# Patient Record
Sex: Female | Born: 1975
Health system: Southern US, Community
[De-identification: ages and names within clinical notes are randomized; demographics above are authoritative.]

## PROBLEM LIST (undated history)

## (undated) DIAGNOSIS — I10 Essential (primary) hypertension: Secondary | ICD-10-CM

---

## 1999-09-06 ENCOUNTER — Inpatient Hospital Stay (HOSPITAL_COMMUNITY): Admission: AD | Admit: 1999-09-06 | Discharge: 1999-09-06 | Payer: Self-pay | Admitting: Obstetrics

## 1999-09-13 ENCOUNTER — Inpatient Hospital Stay (HOSPITAL_COMMUNITY): Admission: AD | Admit: 1999-09-13 | Discharge: 1999-09-13 | Payer: Self-pay | Admitting: Obstetrics

## 1999-12-25 ENCOUNTER — Emergency Department (HOSPITAL_COMMUNITY): Admission: EM | Admit: 1999-12-25 | Discharge: 1999-12-26 | Payer: Self-pay | Admitting: *Deleted

## 2000-01-01 ENCOUNTER — Other Ambulatory Visit: Admission: RE | Admit: 2000-01-01 | Discharge: 2000-01-01 | Payer: Self-pay | Admitting: Obstetrics & Gynecology

## 2000-02-14 ENCOUNTER — Other Ambulatory Visit: Admission: RE | Admit: 2000-02-14 | Discharge: 2000-02-14 | Payer: Self-pay | Admitting: Obstetrics & Gynecology

## 2000-03-21 ENCOUNTER — Inpatient Hospital Stay (HOSPITAL_COMMUNITY): Admission: AD | Admit: 2000-03-21 | Discharge: 2000-03-23 | Payer: Self-pay | Admitting: Obstetrics and Gynecology

## 2000-08-02 ENCOUNTER — Encounter (INDEPENDENT_AMBULATORY_CARE_PROVIDER_SITE_OTHER): Payer: Self-pay | Admitting: Specialist

## 2000-08-02 ENCOUNTER — Other Ambulatory Visit: Admission: RE | Admit: 2000-08-02 | Discharge: 2000-08-02 | Payer: Self-pay | Admitting: Obstetrics

## 2000-12-12 ENCOUNTER — Emergency Department (HOSPITAL_COMMUNITY): Admission: EM | Admit: 2000-12-12 | Discharge: 2000-12-12 | Payer: Self-pay | Admitting: Emergency Medicine

## 2000-12-31 ENCOUNTER — Inpatient Hospital Stay (HOSPITAL_COMMUNITY): Admission: AD | Admit: 2000-12-31 | Discharge: 2000-12-31 | Payer: Self-pay | Admitting: *Deleted

## 2002-01-11 ENCOUNTER — Encounter (INDEPENDENT_AMBULATORY_CARE_PROVIDER_SITE_OTHER): Payer: Self-pay | Admitting: Specialist

## 2002-01-11 ENCOUNTER — Encounter: Payer: Self-pay | Admitting: Obstetrics and Gynecology

## 2002-01-11 ENCOUNTER — Inpatient Hospital Stay (HOSPITAL_COMMUNITY): Admission: AD | Admit: 2002-01-11 | Discharge: 2002-01-13 | Payer: Self-pay | Admitting: Obstetrics and Gynecology

## 2005-01-31 ENCOUNTER — Emergency Department (HOSPITAL_COMMUNITY): Admission: EM | Admit: 2005-01-31 | Discharge: 2005-01-31 | Payer: Self-pay | Admitting: Family Medicine

## 2005-10-02 ENCOUNTER — Emergency Department (HOSPITAL_COMMUNITY): Admission: EM | Admit: 2005-10-02 | Discharge: 2005-10-02 | Payer: Self-pay | Admitting: Family Medicine

## 2006-10-25 ENCOUNTER — Emergency Department (HOSPITAL_COMMUNITY): Admission: EM | Admit: 2006-10-25 | Discharge: 2006-10-25 | Payer: Self-pay | Admitting: Emergency Medicine

## 2008-06-02 ENCOUNTER — Emergency Department (HOSPITAL_COMMUNITY): Admission: EM | Admit: 2008-06-02 | Discharge: 2008-06-02 | Payer: Self-pay | Admitting: Emergency Medicine

## 2010-03-31 ENCOUNTER — Emergency Department (HOSPITAL_COMMUNITY): Admission: EM | Admit: 2010-03-31 | Discharge: 2010-03-31 | Payer: Self-pay | Admitting: Emergency Medicine

## 2011-01-29 LAB — URINALYSIS, ROUTINE W REFLEX MICROSCOPIC
Glucose, UA: NEGATIVE mg/dL
Nitrite: NEGATIVE
Protein, ur: 30 mg/dL — AB

## 2011-01-29 LAB — URINE MICROSCOPIC-ADD ON

## 2012-11-20 ENCOUNTER — Encounter (HOSPITAL_COMMUNITY): Payer: Self-pay | Admitting: Emergency Medicine

## 2012-11-20 ENCOUNTER — Emergency Department (HOSPITAL_COMMUNITY)
Admission: EM | Admit: 2012-11-20 | Discharge: 2012-11-21 | Disposition: A | Discharge status: Home / Self Care | Payer: No Typology Code available for payment source | Attending: Emergency Medicine | Admitting: Emergency Medicine

## 2012-11-20 DIAGNOSIS — Y9389 Activity, other specified: Secondary | ICD-10-CM | POA: Insufficient documentation

## 2012-11-20 DIAGNOSIS — S8990XA Unspecified injury of unspecified lower leg, initial encounter: Secondary | ICD-10-CM | POA: Insufficient documentation

## 2012-11-20 DIAGNOSIS — Y92410 Unspecified street and highway as the place of occurrence of the external cause: Secondary | ICD-10-CM | POA: Insufficient documentation

## 2012-11-20 DIAGNOSIS — M25569 Pain in unspecified knee: Secondary | ICD-10-CM
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
## 2012-11-21 ENCOUNTER — Emergency Department (HOSPITAL_COMMUNITY): Payer: No Typology Code available for payment source

## 2012-11-21 ENCOUNTER — Emergency Department (HOSPITAL_COMMUNITY)
Admission: EM | Admit: 2012-11-21 | Discharge: 2012-11-21 | Disposition: A | Discharge status: Home / Self Care | Payer: No Typology Code available for payment source | Attending: Emergency Medicine | Admitting: Emergency Medicine

## 2012-11-21 ENCOUNTER — Encounter (HOSPITAL_COMMUNITY): Payer: Self-pay | Admitting: *Deleted

## 2012-11-21 DIAGNOSIS — F17200 Nicotine dependence, unspecified, uncomplicated: Secondary | ICD-10-CM | POA: Insufficient documentation

## 2012-11-21 DIAGNOSIS — S161XXA Strain of muscle, fascia and tendon at neck level, initial encounter: Secondary | ICD-10-CM

## 2012-11-21 DIAGNOSIS — S39012A Strain of muscle, fascia and tendon of lower back, initial encounter: Secondary | ICD-10-CM

## 2012-11-21 DIAGNOSIS — S335XXA Sprain of ligaments of lumbar spine, initial encounter: Secondary | ICD-10-CM | POA: Insufficient documentation

## 2012-11-21 DIAGNOSIS — S139XXA Sprain of joints and ligaments of unspecified parts of neck, initial encounter: Secondary | ICD-10-CM | POA: Insufficient documentation

## 2012-11-21 DIAGNOSIS — Y9389 Activity, other specified: Secondary | ICD-10-CM | POA: Insufficient documentation

## 2012-11-21 MED ORDER — IBUPROFEN 800 MG PO TABS: 800.0000 mg | ORAL_TABLET | Freq: Three times a day (TID) | ORAL | Status: DC | PRN

## 2012-11-21 MED ORDER — HYDROCODONE-ACETAMINOPHEN 5-325 MG PO TABS: 1.0000 | ORAL_TABLET | Freq: Four times a day (QID) | ORAL | Status: DC | PRN

## 2012-11-21 MED ORDER — NAPROXEN 375 MG PO TABS: 375.0000 mg | ORAL_TABLET | Freq: Two times a day (BID) | ORAL | Status: DC

## 2012-11-21 MED ORDER — NAPROXEN 250 MG PO TABS
375.0000 mg | ORAL_TABLET | Freq: Once | ORAL | Status: AC
  Administered 2012-11-21: 375 mg via ORAL
  Filled 2012-11-21: qty 2
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
## 2012-11-21 NOTE — ED Provider Notes (Signed)
  Medical screening examination/treatment/procedure(s) were performed by non-physician practitioner and as supervising physician I was immediately available for consultation/collaboration.    Vida Roller, MD 11/21/12 [phone]

## 2012-11-21 NOTE — Progress Notes (Signed)
Pt listed with no insurance coverage CM and Partnership for [facility] liaison spoke with pt. Pt offered services to assist with finding a [place] self pay provider & health reform information- Pt recently married Husband at bedside Received information from liaison  Pt states pcp is her ob gyn provider
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
## 2012-11-21 NOTE — ED Notes (Signed)
Patient transported to X-ray 
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
## 2012-11-28 NOTE — ED Provider Notes (Signed)
Medical screening examination/treatment/procedure(s) were performed by non-physician practitioner and as supervising physician I was immediately available for consultation/collaboration.   Hillman Attig M Kalyiah Saintil, MD 11/28/12 2120 

## 2013-08-18 ENCOUNTER — Emergency Department (HOSPITAL_COMMUNITY)
Admission: EM | Admit: 2013-08-18 | Discharge: 2013-08-18 | Disposition: A | Discharge status: Home / Self Care | Payer: No Typology Code available for payment source | Attending: Emergency Medicine | Admitting: Emergency Medicine

## 2013-08-18 ENCOUNTER — Encounter (HOSPITAL_COMMUNITY): Payer: Self-pay | Admitting: Emergency Medicine

## 2013-08-18 DIAGNOSIS — I10 Essential (primary) hypertension: Secondary | ICD-10-CM | POA: Diagnosis not present

## 2013-08-18 DIAGNOSIS — Y9389 Activity, other specified: Secondary | ICD-10-CM | POA: Insufficient documentation

## 2013-08-18 DIAGNOSIS — S0993XA Unspecified injury of face, initial encounter: Secondary | ICD-10-CM | POA: Diagnosis present

## 2013-08-18 DIAGNOSIS — F17200 Nicotine dependence, unspecified, uncomplicated: Secondary | ICD-10-CM | POA: Insufficient documentation

## 2013-08-18 DIAGNOSIS — Y92410 Unspecified street and highway as the place of occurrence of the external cause: Secondary | ICD-10-CM | POA: Insufficient documentation

## 2013-08-18 HISTORY — DX: Essential (primary) hypertension: I10

## 2013-08-18 MED ORDER — NAPROXEN 500 MG PO TABS: 500.0000 mg | ORAL_TABLET | Freq: Two times a day (BID) | ORAL | Status: DC

## 2013-08-18 MED ORDER — IBUPROFEN 400 MG PO TABS
800.0000 mg | ORAL_TABLET | Freq: Once | ORAL | Status: AC
  Administered 2013-08-18: 800 mg via ORAL
  Filled 2013-08-18: qty 2

## 2013-08-18 MED ORDER — DIAZEPAM 5 MG PO TABS
5.0000 mg | ORAL_TABLET | Freq: Once | ORAL | Status: AC
  Administered 2013-08-18: 5 mg via ORAL
  Filled 2013-08-18: qty 1

## 2013-08-18 MED ORDER — DIAZEPAM 5 MG PO TABS: 5.0000 mg | ORAL_TABLET | Freq: Two times a day (BID) | ORAL | Status: DC

## 2013-08-18 NOTE — ED Notes (Signed)
Pt dc to home. Pt sts understanding to dc instructions. Pt ambulatory to exit without difficulty.  Pt denies need for w/c.  
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
## 2013-08-19 ENCOUNTER — Encounter (HOSPITAL_COMMUNITY): Payer: Self-pay | Admitting: Emergency Medicine

## 2013-08-19 ENCOUNTER — Emergency Department (HOSPITAL_COMMUNITY)
Admission: EM | Admit: 2013-08-19 | Discharge: 2013-08-19 | Disposition: A | Discharge status: Home / Self Care | Payer: No Typology Code available for payment source | Attending: Emergency Medicine | Admitting: Emergency Medicine

## 2013-08-19 ENCOUNTER — Emergency Department (HOSPITAL_COMMUNITY): Payer: No Typology Code available for payment source

## 2013-08-19 DIAGNOSIS — M542 Cervicalgia: Secondary | ICD-10-CM | POA: Insufficient documentation

## 2013-08-19 DIAGNOSIS — G8911 Acute pain due to trauma: Secondary | ICD-10-CM | POA: Insufficient documentation

## 2013-08-19 DIAGNOSIS — Z79899 Other long term (current) drug therapy: Secondary | ICD-10-CM | POA: Insufficient documentation

## 2013-08-19 DIAGNOSIS — F17200 Nicotine dependence, unspecified, uncomplicated: Secondary | ICD-10-CM | POA: Insufficient documentation

## 2013-08-19 DIAGNOSIS — I10 Essential (primary) hypertension: Secondary | ICD-10-CM | POA: Insufficient documentation

## 2013-08-19 DIAGNOSIS — S139XXD Sprain of joints and ligaments of unspecified parts of neck, subsequent encounter: Secondary | ICD-10-CM

## 2013-08-19 MED ORDER — HYDROCODONE-ACETAMINOPHEN 5-325 MG PO TABS
1.0000 | ORAL_TABLET | Freq: Once | ORAL | Status: AC
  Administered 2013-08-19: 1 via ORAL
  Filled 2013-08-19: qty 1

## 2013-08-19 MED ORDER — HYDROCODONE-ACETAMINOPHEN 5-325 MG PO TABS: 1.0000 | ORAL_TABLET | ORAL | Status: DC | PRN
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
## 2013-08-19 NOTE — ED Notes (Signed)
Patient transported to X-ray 
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
## 2013-08-20 NOTE — ED Provider Notes (Signed)
Medical screening examination/treatment/procedure(s) were performed by non-physician practitioner and as supervising physician I was immediately available for consultation/collaboration.   Alvah Gilder S Jaclyn Carew, MD 08/20/13 0024 

## 2013-08-21 NOTE — ED Provider Notes (Signed)
Medical screening examination/treatment/procedure(s) were performed by non-physician practitioner and as supervising physician I was immediately available for consultation/collaboration.  Raychel Dowler R. Stepen Prins, MD 08/21/13 1016 

## 2013-09-04 ENCOUNTER — Other Ambulatory Visit: Payer: Self-pay | Admitting: *Deleted

## 2015-02-03 ENCOUNTER — Other Ambulatory Visit (HOSPITAL_COMMUNITY): Payer: Self-pay | Admitting: Obstetrics

## 2015-02-03 DIAGNOSIS — Z1231 Encounter for screening mammogram for malignant neoplasm of breast: Secondary | ICD-10-CM

## 2015-02-09 ENCOUNTER — Ambulatory Visit (HOSPITAL_COMMUNITY): Payer: No Typology Code available for payment source | Attending: Obstetrics

## 2015-02-10 ENCOUNTER — Ambulatory Visit (HOSPITAL_COMMUNITY): Payer: No Typology Code available for payment source

## 2015-02-10 ENCOUNTER — Other Ambulatory Visit (HOSPITAL_COMMUNITY): Payer: Self-pay | Admitting: Obstetrics

## 2015-02-10 DIAGNOSIS — Z1231 Encounter for screening mammogram for malignant neoplasm of breast: Secondary | ICD-10-CM

## 2015-02-17 ENCOUNTER — Ambulatory Visit (HOSPITAL_COMMUNITY): Admission: RE | Admit: 2015-02-17 | Payer: No Typology Code available for payment source | Source: Ambulatory Visit

## 2016-08-04 LAB — HM PAP SMEAR

## 2019-03-27 ENCOUNTER — Other Ambulatory Visit: Payer: Self-pay

## 2019-03-27 ENCOUNTER — Ambulatory Visit
Admission: EM | Admit: 2019-03-27 | Discharge: 2019-03-27 | Disposition: A | Discharge status: Home / Self Care | Payer: Self-pay | Attending: Physician Assistant | Admitting: Physician Assistant

## 2019-03-27 ENCOUNTER — Encounter: Payer: Self-pay | Admitting: Emergency Medicine

## 2019-03-27 DIAGNOSIS — L84 Corns and callosities: Secondary | ICD-10-CM

## 2019-03-27 DIAGNOSIS — M79674 Pain in right toe(s): Secondary | ICD-10-CM

## 2019-03-27 DIAGNOSIS — I10 Essential (primary) hypertension: Secondary | ICD-10-CM

## 2019-03-27 MED ORDER — SALICYLIC ACID 17 % EX GEL: Freq: Every day | CUTANEOUS | 0 refills | Status: DC

## 2019-03-27 NOTE — ED Provider Notes (Signed)
[facility]    CSN: [id number] Arrival date & time: 03/27/19  1221     History   Chief Complaint Chief Complaint  Patient presents with  . Toe Pain    HPI Beverly Miller is a 43 y.o. female.   43 year old female comes in for 1 week history of skin irritation/pain between her 4th and 5th toe. Her work requires long hours of standing/walking and has been working more hours recently. States pain with walking and can have swelling. She denies open wound, erythema, warmth. Denies injury/trauma. Denies ill fitted shoes. Tried using neosporin without relief.        Past Medical History:  Diagnosis Date  . Hypertension     There are no active problems to display for this patient.   History reviewed. No pertinent surgical history.  OB History   No obstetric history on file.      Home Medications    Prior to Admission medications   Medication Sig Start Date End Date Taking? Authorizing Provider  acetaminophen (TYLENOL) 325 MG tablet Take 650 mg by mouth every 6 (six) hours as needed for pain (pain).    [provider]  salicylic acid 17 % gel Apply topically daily. 03/27/19   Belinda Fisher, PA-C    Family History History reviewed. No pertinent family history.  Social History Social History   Tobacco Use  . Smoking status: Former Smoker    Last attempt to quit: 11/12/2018    Years since quitting: 0.3  . Smokeless tobacco: Never Used  Substance Use Topics  . Alcohol use: No  . Drug use: No     Allergies   Patient has no known allergies.   Review of Systems Review of Systems  Reason unable to perform ROS: See HPI as above.     Physical Exam Triage Vital Signs ED Triage Vitals  Enc Vitals Group     BP 03/27/19 1239 (!) 171/120     Pulse Rate 03/27/19 1239 77     Resp 03/27/19 1239 18     Temp 03/27/19 1239 97.9 F (36.6 C)     Temp Source 03/27/19 1239 Oral     SpO2 03/27/19 1239 98 %     Weight --      Height --      Head  Circumference --      Peak Flow --      Pain Score 03/27/19 1237 4     Pain Loc --      Pain Edu? --      Excl. in GC? --    No data found.  Updated Vital Signs BP (!) 171/120 (BP Location: Left Arm)   Pulse 77   Temp 97.9 F (36.6 C) (Oral)   Resp 18   LMP 03/13/2019   SpO2 98%   Physical Exam Constitutional:      General: She is not in acute distress.    Appearance: She is well-developed. She is not diaphoretic.  HENT:     Head: Normocephalic and atraumatic.  Eyes:     Conjunctiva/sclera: Conjunctivae normal.     Pupils: Pupils are equal, round, and reactive to light.  Musculoskeletal:     Comments: No obvious of swelling, erythema, warmth, contusion, open wounds. Callus between both 4th and 5th toe. No obvious tenderness to palpation. Full ROM of toes. Sensation intact.   Skin:    General: Skin is warm and dry.  Neurological:  Mental Status: She is alert and oriented to person, place, and time.      UC Treatments / Results  Labs (all labs ordered are listed, but only abnormal results are displayed) Labs Reviewed - No data to display  EKG None  Radiology No results found.  Procedures Procedures (including critical care time)  Medications Ordered in UC Medications - No data to display  Initial Impression / Assessment and Plan / UC Course  I have reviewed the triage vital signs and the nursing notes.  Pertinent labs & imaging results that were available during my care of the patient were reviewed by me and considered in my medical decision making (see chart for details).    No open wounds at this time. Discussed using salicylic acid to soften callus. Discussed using dressing to prevent further irritation. Return precautions given. Patient expresses understanding and agrees to plan.  Final Clinical Impressions(s) / UC Diagnoses   Final diagnoses:  Pain of toe of right foot  Callus of foot    ED Prescriptions    Medication Sig Dispense Auth.  Provider   salicylic acid 17 % gel Apply topically daily. 15 g Threasa AlphaYu, Amy V, PA-C        Yu, Amy V, [place] 03/27/19 1337

## 2019-03-27 NOTE — Discharge Instructions (Signed)
No wound at this time. No signs of infection. Use corn dressing to prevent further pressure. Warm soaks of the foot for 5 mins prior to application of salicylic acid. Monitor for open wound/ulceration, follow up for reevaluation needed.

## 2019-03-27 NOTE — ED Triage Notes (Signed)
Pt presents to [facility] for assessment of irritation to the skin of the 4th and 5th toe x 1 week, worse when she puts a shoe on and it rubs.

## 2019-03-27 NOTE — ED Notes (Signed)
Patient able to ambulate independently  

## 2019-08-27 ENCOUNTER — Ambulatory Visit
Admission: EM | Admit: 2019-08-27 | Discharge: 2019-08-27 | Disposition: A | Discharge status: Home / Self Care | Payer: HRSA Program | Attending: Physician Assistant | Admitting: Physician Assistant

## 2019-08-27 DIAGNOSIS — Z20828 Contact with and (suspected) exposure to other viral communicable diseases: Secondary | ICD-10-CM | POA: Insufficient documentation

## 2019-08-27 DIAGNOSIS — R519 Headache, unspecified: Secondary | ICD-10-CM | POA: Insufficient documentation

## 2019-08-27 DIAGNOSIS — N898 Other specified noninflammatory disorders of vagina: Secondary | ICD-10-CM | POA: Insufficient documentation

## 2019-08-27 DIAGNOSIS — Z20822 Contact with and (suspected) exposure to covid-19: Secondary | ICD-10-CM

## 2019-08-27 DIAGNOSIS — R030 Elevated blood-pressure reading, without diagnosis of hypertension: Secondary | ICD-10-CM | POA: Insufficient documentation

## 2019-08-27 MED ORDER — ONDANSETRON 4 MG PO TBDP: 4.0000 mg | ORAL_TABLET | Freq: Three times a day (TID) | ORAL | 0 refills | Status: DC | PRN

## 2019-08-27 NOTE — ED Provider Notes (Signed)
[facility]    CSN: [id number] Arrival date & time: 08/27/19  1858      History   Chief Complaint Chief Complaint  Patient presents with  . Headache    HPI Beverly Miller is a 43 y.o. female.   43 year old female comes in for multiple complaints.  1.  Positive Covid exposure with headache and nausea.  States has had symptoms for the past 2 days.  States headache is frontal, intermittent, throbbing in sensation.  States when headache is severe, can have photophobia.  Denies phonophobia.  She does have nausea and vomiting, but states is unrelated to headache and is also intermittent.  She has had 2 episodes of nonbilious nonbloody emesis, last episode 2 days ago.  Has since then been able to tolerate oral intake.  She feels weak without one-sided weakness, dizziness, syncope.  Denies URI symptoms such as cough, congestion, sore throat.  Denies fever, chills, body aches.  She feels that she has loss of taste intermittently.  Denies shortness of breath.  Denies abdominal pain, diarrhea.  Former smoker.  Patient was found to be hypertensive at 216/121.  She denies current headache, nausea, vomiting.  Denies one-sided weakness, dizziness.  Denies chest pain, shortness of breath, blurry vision.  Patient states has history of elevated blood pressure readings during labor, but denies ever needing blood pressure medication.  2.  Vaginal discharge x1 month.  Has had intermittent vaginal itching with "different odor".  States "it is just different, I know my body".  Denies abdominal pain.  Denies urinary symptoms such as frequency, dysuria, hematuria.  Sexually active with one female partner, no condom use.  Patient states she is perimenopausal, has not had a cycle in 8 months.     Past Medical History:  Diagnosis Date  . Hypertension     There are no active problems to display for this patient.   History reviewed. No pertinent surgical history.  OB History   No obstetric  history on file.      Home Medications    Prior to Admission medications   Medication Sig Start Date End Date Taking? Authorizing Provider  ondansetron (ZOFRAN ODT) 4 MG disintegrating tablet Take 1 tablet (4 mg total) by mouth every 8 (eight) hours as needed for nausea or vomiting. 08/27/19   Belinda Fisher, PA-C    Family History No family history on file.  Social History Social History   Tobacco Use  . Smoking status: Former Smoker    Quit date: 11/12/2018    Years since quitting: 0.7  . Smokeless tobacco: Never Used  Substance Use Topics  . Alcohol use: No  . Drug use: No     Allergies   Patient has no known allergies.   Review of Systems Review of Systems  Reason unable to perform ROS: See HPI as above.     Physical Exam Triage Vital Signs ED Triage Vitals  Enc Vitals Group     BP 08/27/19 1906 (!) 219/140     Pulse Rate 08/27/19 1906 81     Resp 08/27/19 1906 18     Temp 08/27/19 1906 98 F (36.7 C)     Temp Source 08/27/19 1906 Oral     SpO2 08/27/19 1906 98 %     Weight --      Height --      Head Circumference --      Peak Flow --      Pain Score 08/27/19 1907 0  Pain Loc --      Pain Edu? --      Excl. in [place]? --    No data found.  Updated Vital Signs BP (!) 186/109 (BP Location: Right Arm)   Pulse 81   Temp 98 F (36.7 C) (Oral)   Resp 18   SpO2 98%   Physical Exam Constitutional:      General: She is not in acute distress.    Appearance: Normal appearance. She is not ill-appearing or toxic-appearing.  HENT:     Head: Normocephalic and atraumatic.     Mouth/Throat:     Mouth: Mucous membranes are moist.     Pharynx: Oropharynx is clear. Uvula midline.  Eyes:     Extraocular Movements: Extraocular movements intact.     Conjunctiva/sclera: Conjunctivae normal.     Pupils: Pupils are equal, round, and reactive to light.     Comments: No photophobia on exam.  Neck:     Musculoskeletal: Normal range of motion and neck supple.   Cardiovascular:     Rate and Rhythm: Normal rate and regular rhythm.     Heart sounds: Normal heart sounds. No murmur. No friction rub. No gallop.   Pulmonary:     Effort: Pulmonary effort is normal. No accessory muscle usage, prolonged expiration, respiratory distress or retractions.     Comments: Lungs clear to auscultation without adventitious lung sounds. Skin:    Comments: Patient became diaphoretic during exam, stating having a hot flash. Skin warm. No paleness.   Neurological:     General: No focal deficit present.     Mental Status: She is alert and oriented to person, place, and time.     GCS: GCS eye subscore is 4. GCS verbal subscore is 5. GCS motor subscore is 6.     Cranial Nerves: Cranial nerves are intact.     Sensory: Sensation is intact.     Motor: Motor function is intact.     Coordination: Coordination is intact.     Gait: Gait is intact.     Comments: Able to ambulate on own without difficulty.      UC Treatments / Results  Labs (all labs ordered are listed, but only abnormal results are displayed) Labs Reviewed  NOVEL CORONAVIRUS, NAA  CERVICOVAGINAL ANCILLARY ONLY    EKG   Radiology No results found.  Procedures Procedures (including critical care time)  Medications Ordered in UC Medications - No data to display  Initial Impression / Assessment and Plan / UC Course  I have reviewed the triage vital signs and the nursing notes.  Pertinent labs & imaging results that were available during my care of the patient were reviewed by me and considered in my medical decision making (see chart for details).    Patient became diaphoretic during exam, stating having a hot flash.  Resolved after cold water.  Patient without diaphoresis at reevaluation.   1. Elevated blood pressure reading: Patient hypertensive at 216/121.  She states that she is "very nervous".  At this time, no signs of hypertensive emergency.  Neurology exam without focal deficits.   Patient denies chest pain or shortness of breath.  We will continue to monitor and recheck blood pressure after rest.  BP decreased to 186/109 with 15 mins rest. Discussed obtaining BP cuff for checking at home. If above 130/90 consistently, will need follow up with PCP for evaluation of hypertension. Return precautions given.   2. COVID exposure, headache, nausea/vomiting:  Discussed with patient, cannot  rule out Covid causing symptoms.  Will send for testing.  Patient to remain in quarantine until testing results return.  Discussed symptomatic treatment.  Return precautions given.  3. Vaginal discharge: Cytology sent, patient will be contacted with any positive results that require additional treatment. Patient to refrain from sexual activity for the next 7 days. Return precautions given.   Patient expresses understanding and agrees to plan as per above.   Final Clinical Impressions(s) / UC Diagnoses   Final diagnoses:  Exposure to COVID-19 virus  Acute intractable headache, unspecified headache type  Vaginal discharge  Elevated blood pressure reading    ED Prescriptions    Medication Sig Dispense Auth. Provider   ondansetron (ZOFRAN ODT) 4 MG disintegrating tablet Take 1 tablet (4 mg total) by mouth every 8 (eight) hours as needed for nausea or vomiting. 15 tablet Belinda FisherYu, Taheerah Guldin V, PA-C     PDMP not reviewed this encounter.   Belinda FisherYu, Ronen Bromwell V, PA-C 08/27/19 1959

## 2019-08-27 NOTE — Discharge Instructions (Addendum)
As discussed, blood pressure decreased significantly with rest. Will have you get a blood pressure cuff to document your blood pressure. If your blood pressure is consistently above 130/90, please follow up with PCP for further evaluation and management needed.  If you develop sudden worsening headache, nausea/vomiting, dizziness, weakness, blurry vision, chest pain, shortness of breath, go to the emergency department for further evaluation.  As discussed, cannot rule out COVID. Testing ordered. I would like you to quarantine until testing results. Zofran as needed for nausea/vomiting. Continue tylenol for headache if needed. If experiencing shortness of breath, trouble breathing, call 911 and provide them with your current situation.   Cytology sent, you will be contacted with any positive results that requires further treatment. Refrain from sexual activity and alcohol use for the next 7 days. Monitor for any worsening of symptoms, fever, abdominal pain, nausea/vomiting not controlled by medicine, go to the ED for further evaluation needed.

## 2019-08-27 NOTE — ED Triage Notes (Signed)
Pt states had a positive exposure to COVID. C/o headache with nausea x2days

## 2019-08-30 LAB — NOVEL CORONAVIRUS, NAA: SARS-CoV-2, NAA: NOT DETECTED

## 2019-09-01 ENCOUNTER — Telehealth (HOSPITAL_COMMUNITY): Payer: Self-pay | Admitting: Emergency Medicine

## 2019-09-01 LAB — CERVICOVAGINAL ANCILLARY ONLY
Bacterial vaginitis: POSITIVE — AB
Candida vaginitis: POSITIVE — AB
Chlamydia: NEGATIVE
Neisseria Gonorrhea: NEGATIVE
Trichomonas: POSITIVE — AB

## 2019-09-01 MED ORDER — METRONIDAZOLE 500 MG PO TABS: 500.0000 mg | ORAL_TABLET | Freq: Two times a day (BID) | ORAL | 0 refills | Status: AC

## 2019-09-01 MED ORDER — FLUCONAZOLE 150 MG PO TABS: 150.0000 mg | ORAL_TABLET | Freq: Once | ORAL | 0 refills | Status: AC

## 2019-09-01 NOTE — Telephone Encounter (Signed)
Bacterial vaginosis is positive. This was not treated at the urgent care visit.  Flagyl 500 mg BID x 7 days #14 no refills sent to patients pharmacy of choice.    Test for candida (yeast) was positive.  Prescription for fluconazole 150mg  po now, repeat dose in 3d if needed, #2 no refills, sent to the pharmacy of record.  Recheck or followup with PCP for further evaluation if symptoms are not improving.    Trichomonas is positive. Rx  for Flagyl 2 grams, once was sent to the pharmacy of record. Pt needs education to refrain from sexual intercourse for 7 days to give the medicine time to work. Sexual partners need to be notified and tested/treated. Condoms may reduce risk of reinfection. Recheck for further evaluation if symptoms are not improving.   Patient contacted and made aware of    results, all questions answered

## 2019-09-18 ENCOUNTER — Ambulatory Visit
Admission: EM | Admit: 2019-09-18 | Discharge: 2019-09-18 | Disposition: A | Discharge status: Home / Self Care | Payer: Medicaid Other | Attending: Emergency Medicine | Admitting: Emergency Medicine

## 2019-09-18 DIAGNOSIS — R519 Headache, unspecified: Secondary | ICD-10-CM

## 2019-09-18 DIAGNOSIS — Z20828 Contact with and (suspected) exposure to other viral communicable diseases: Secondary | ICD-10-CM

## 2019-09-18 DIAGNOSIS — Z20822 Contact with and (suspected) exposure to covid-19: Secondary | ICD-10-CM

## 2019-09-18 NOTE — Discharge Instructions (Signed)
Your COVID test is pending - it is important to quarantine / isolate at home until your results are back. °If you test positive and would like further evaluation for persistent or worsening symptoms, you may schedule an E-visit or virtual (video) visit throughout the [facility] MyChart app or website. ° °PLEASE NOTE: If you develop severe chest pain or shortness of breath please go to the ER or call 9-1-1 for further evaluation --> DO NOT schedule electronic or virtual visits for this. °Please call our office for further guidance / recommendations as needed. °

## 2019-09-18 NOTE — ED Provider Notes (Signed)
[facility]    CSN: [id number] Arrival date & time: 09/18/19  1343      History   Chief Complaint Chief Complaint  Patient presents with  . Headache    HPI Beverly Miller is a 43 y.o. female with history of hypertension presenting for Covid testing.  States that she has had repeated exposure due to numerous family members testing positive.  Patient underwent testing on 08/27/2019: Negative.  Patient states she was asymptomatic at the time, though has developed loss of taste, smell with associated mild, frontal headache for the last 4 days.  Headache alleviated by ibuprofen, Tylenol.  Reports good appetite/p.o. intake.   Past Medical History:  Diagnosis Date  . Hypertension     There are no active problems to display for this patient.   History reviewed. No pertinent surgical history.  OB History   No obstetric history on file.      Home Medications    Prior to Admission medications   Not on File    Family History No family history on file.  Social History Social History   Tobacco Use  . Smoking status: Former Smoker    Quit date: 11/12/2018    Years since quitting: 0.8  . Smokeless tobacco: Never Used  Substance Use Topics  . Alcohol use: No  . Drug use: No     Allergies   Patient has no known allergies.   Review of Systems Review of Systems  Constitutional: Negative for fatigue and fever.  HENT: Negative for congestion, dental problem, ear pain, facial swelling, hearing loss, nosebleeds, postnasal drip, rhinorrhea, sinus pressure, sinus pain, sneezing, sore throat, tinnitus, trouble swallowing and voice change.   Eyes: Negative for photophobia, pain, redness and visual disturbance.  Respiratory: Negative for cough and shortness of breath.   Cardiovascular: Negative for chest pain and palpitations.  Gastrointestinal: Negative for abdominal pain, diarrhea and vomiting.  Musculoskeletal: Negative for arthralgias and myalgias.  Skin:  Negative for rash and wound.  Neurological: Positive for headaches. Negative for dizziness, syncope, facial asymmetry, weakness and light-headedness.       Positive for loss of smell/taste.  No headache in office today     Physical Exam Triage Vital Signs ED Triage Vitals  Enc Vitals Group     BP 09/18/19 1405 (!) 147/95     Pulse Rate 09/18/19 1405 80     Resp 09/18/19 1405 18     Temp 09/18/19 1405 98.8 F (37.1 C)     Temp Source 09/18/19 1405 Oral     SpO2 09/18/19 1405 98 %     Weight --      Height --      Head Circumference --      Peak Flow --      Pain Score 09/18/19 1406 7     Pain Loc --      Pain Edu? --      Excl. in [place]? --    No data found.  Updated Vital Signs BP (!) 147/95 (BP Location: Left Arm)   Pulse 80   Temp 98.8 F (37.1 C) (Oral)   Resp 18   SpO2 98%   Visual Acuity Right Eye Distance:   Left Eye Distance:   Bilateral Distance:    Right Eye Near:   Left Eye Near:    Bilateral Near:     Physical Exam Constitutional:      General: She is not in acute distress. HENT:  Head: Normocephalic and atraumatic.  Eyes:     General: No scleral icterus.    Pupils: Pupils are equal, round, and reactive to light.  Cardiovascular:     Rate and Rhythm: Normal rate.  Pulmonary:     Effort: Pulmonary effort is normal. No respiratory distress.     Breath sounds: No wheezing.  Skin:    Coloration: Skin is not jaundiced or pale.  Neurological:     Mental Status: She is alert and oriented to person, place, and time.      UC Treatments / Results  Labs (all labs ordered are listed, but only abnormal results are displayed) Labs Reviewed  NOVEL CORONAVIRUS, NAA    EKG   Radiology No results found.  Procedures Procedures (including critical care time)  Medications Ordered in UC Medications - No data to display  Initial Impression / Assessment and Plan / UC Course  I have reviewed the triage vital signs and the nursing notes.   Pertinent labs & imaging results that were available during my care of the patient were reviewed by me and considered in my medical decision making (see chart for details).     Final Clinical Impressions(s) / UC Diagnoses   Final diagnoses:  Close exposure to COVID-19 virus     Discharge Instructions     Your COVID test is pending - it is important to quarantine / isolate at home until your results are back. If you test positive and would like further evaluation for persistent or worsening symptoms, you may schedule an E-visit or virtual (video) visit throughout the [facility] app or website.  PLEASE NOTE: If you develop severe chest pain or shortness of breath please go to the ER or call 9-1-1 for further evaluation --> DO NOT schedule electronic or virtual visits for this. Please call our office for further guidance / recommendations as needed.    ED Prescriptions    None     PDMP not reviewed this encounter.   Hall[place], [place], [place] 09/18/19 1801

## 2019-09-18 NOTE — ED Triage Notes (Signed)
Pt states exposed to positive COVID, c/o loss of taste and loss of smell and a headache x4 days

## 2019-09-21 LAB — NOVEL CORONAVIRUS, NAA: SARS-CoV-2, NAA: DETECTED — AB

## 2019-09-22 ENCOUNTER — Encounter (HOSPITAL_COMMUNITY): Payer: Self-pay

## 2019-09-22 ENCOUNTER — Telehealth (HOSPITAL_COMMUNITY): Payer: Self-pay | Admitting: Emergency Medicine

## 2019-09-22 NOTE — Telephone Encounter (Signed)
Positive Covid detected on swab. Attempted to reach patient to discuss results. No answer, no voicemail setup. Sent Mychart message.

## 2019-09-24 ENCOUNTER — Telehealth (HOSPITAL_COMMUNITY): Payer: Self-pay | Admitting: Emergency Medicine

## 2019-09-24 NOTE — Telephone Encounter (Signed)
Patient contacted and made aware of  Positive covid  results, all questions answered   

## 2019-11-05 ENCOUNTER — Ambulatory Visit: Payer: Medicaid Other | Attending: Internal Medicine

## 2019-11-05 DIAGNOSIS — Z20822 Contact with and (suspected) exposure to covid-19: Secondary | ICD-10-CM

## 2019-11-06 LAB — NOVEL CORONAVIRUS, NAA: SARS-CoV-2, NAA: NOT DETECTED

## 2019-11-22 ENCOUNTER — Other Ambulatory Visit: Payer: Self-pay

## 2019-11-22 ENCOUNTER — Ambulatory Visit
Admission: EM | Admit: 2019-11-22 | Discharge: 2019-11-22 | Disposition: A | Discharge status: Home / Self Care | Payer: Medicaid Other | Attending: Physician Assistant | Admitting: Physician Assistant

## 2019-11-22 DIAGNOSIS — Z20822 Contact with and (suspected) exposure to covid-19: Secondary | ICD-10-CM

## 2019-11-22 DIAGNOSIS — R519 Headache, unspecified: Secondary | ICD-10-CM

## 2019-11-22 DIAGNOSIS — I10 Essential (primary) hypertension: Secondary | ICD-10-CM | POA: Diagnosis not present

## 2019-11-22 DIAGNOSIS — J3489 Other specified disorders of nose and nasal sinuses: Secondary | ICD-10-CM

## 2019-11-22 DIAGNOSIS — Z8616 Personal history of COVID-19: Secondary | ICD-10-CM

## 2019-11-22 NOTE — ED Provider Notes (Signed)
[facility]    CSN: [id number] Arrival date & time: 11/22/19  1400      History   Chief Complaint Chief Complaint  Patient presents with  . Headache    HPI Beverly Miller is a 44 y.o. female.   44 year old female comes in for 2 day of URI symptoms. Has had headache, rhinorrhea. Living with positive COVID family member. Patient was positive for COVID 09/18/2019, has had negative COVID test 11/07/2019. Denies cough. Denies fever, chills, body aches. Denies abdominal pain, nausea, vomiting, diarrhea. Denies shortness of breath, loss of taste/smell.      Past Medical History:  Diagnosis Date  . Hypertension     There are no problems to display for this patient.   History reviewed. No pertinent surgical history.  OB History   No obstetric history on file.      Home Medications    Prior to Admission medications   Not on File    Family History History reviewed. No pertinent family history.  Social History Social History   Tobacco Use  . Smoking status: Former Smoker    Quit date: 11/12/2018    Years since quitting: 1.0  . Smokeless tobacco: Never Used  Substance Use Topics  . Alcohol use: No  . Drug use: No     Allergies   Patient has no known allergies.   Review of Systems Review of Systems  Reason unable to perform ROS: See HPI as above.     Physical Exam Triage Vital Signs ED Triage Vitals  Enc Vitals Group     BP 11/22/19 1430 (!) 187/95     Pulse Rate 11/22/19 1430 81     Resp 11/22/19 1430 16     Temp 11/22/19 1430 98 F (36.7 C)     Temp Source 11/22/19 1430 Oral     SpO2 11/22/19 1430 98 %     Weight --      Height --      Head Circumference --      Peak Flow --      Pain Score 11/22/19 1432 7     Pain Loc --      Pain Edu? --      Excl. in [place]? --    No data found.  Updated Vital Signs BP (!) 187/95 (BP Location: Left Arm)   Pulse 81   Temp 98 F (36.7 C) (Oral)   Resp 16   SpO2 98%   Visual Acuity  Right Eye Distance:   Left Eye Distance:   Bilateral Distance:    Right Eye Near:   Left Eye Near:    Bilateral Near:     Physical Exam Constitutional:      General: She is not in acute distress.    Appearance: Normal appearance. She is not ill-appearing, toxic-appearing or diaphoretic.  HENT:     Head: Normocephalic and atraumatic.     Mouth/Throat:     Mouth: Mucous membranes are moist.     Pharynx: Oropharynx is clear. Uvula midline.  Cardiovascular:     Rate and Rhythm: Normal rate and regular rhythm.     Heart sounds: Normal heart sounds. No murmur. No friction rub. No gallop.   Pulmonary:     Effort: Pulmonary effort is normal. No accessory muscle usage, prolonged expiration, respiratory distress or retractions.     Comments: Lungs clear to auscultation without adventitious lung sounds. Musculoskeletal:     Cervical back: Normal range of  motion and neck supple.  Neurological:     General: No focal deficit present.     Mental Status: She is alert and oriented to person, place, and time.      UC Treatments / Results  Labs (all labs ordered are listed, but only abnormal results are displayed) Labs Reviewed  NOVEL CORONAVIRUS, NAA    EKG   Radiology No results found.  Procedures Procedures (including critical care time)  Medications Ordered in UC Medications - No data to display  Initial Impression / Assessment and Plan / UC Course  I have reviewed the triage vital signs and the nursing notes.  Pertinent labs & imaging results that were available during my care of the patient were reviewed by me and considered in my medical decision making (see chart for details).    COVID PCR test ordered. However, given patient's exposure, will need to quarantine for 14 days if test is negative. No alarming signs on exam.  Patient speaking in full sentences without respiratory distress.  Symptomatic treatment discussed.  Push fluids.  Return precautions given.  Patient  expresses understanding and agrees to plan.  Final Clinical Impressions(s) / UC Diagnoses   Final diagnoses:  Close exposure to COVID-19 virus  Acute intractable headache, unspecified headache type  Rhinorrhea   ED Prescriptions    None     PDMP not reviewed this encounter.   Ok Edwards, PA-C 11/22/19 1458

## 2019-11-22 NOTE — ED Triage Notes (Signed)
Pt c/o headache and runny nose x2days. States had a positive covid exposure on 11/16/19. Pt states had a neg test on 12/26

## 2019-11-22 NOTE — Discharge Instructions (Signed)
COVID PCR testing ordered. As discussed, given your exposure and symptoms, I would like you to quarantine as if you are positive for COVID regardless of symptoms.  You can discontinue quarantine after 10 days of symptom onset AND 24 hours of no fever with improvement of symptoms. You can take over the counter flonase/nasacort to help with nasal congestion/drainage. Tylenol/motrin for pain and fever. Keep hydrated, urine should be clear to pale yellow in color. If experiencing shortness of breath, trouble breathing, go to the emergency department for further evaluation needed.   To make appointment: You can text "COVID" to [phone] OR log on to [email or web address] If no smart phone or PC, you can call [phone] for assistance in setting up appointments.  COVID drive through sites:  [place]: [address] [place]: [address] [place]: [address]

## 2019-11-23 LAB — NOVEL CORONAVIRUS, NAA: SARS-CoV-2, NAA: NOT DETECTED

## 2020-05-09 ENCOUNTER — Encounter: Payer: Self-pay | Admitting: Emergency Medicine

## 2020-05-09 ENCOUNTER — Ambulatory Visit
Admission: EM | Admit: 2020-05-09 | Discharge: 2020-05-09 | Disposition: A | Discharge status: Home / Self Care | Payer: Self-pay | Attending: Emergency Medicine | Admitting: Emergency Medicine

## 2020-05-09 ENCOUNTER — Other Ambulatory Visit: Payer: Self-pay

## 2020-05-09 DIAGNOSIS — R519 Headache, unspecified: Secondary | ICD-10-CM

## 2020-05-09 DIAGNOSIS — L309 Dermatitis, unspecified: Secondary | ICD-10-CM

## 2020-05-09 DIAGNOSIS — Z20822 Contact with and (suspected) exposure to covid-19: Secondary | ICD-10-CM

## 2020-05-09 MED ORDER — TRIAMCINOLONE ACETONIDE 0.5 % EX OINT: 1.0000 "application " | TOPICAL_OINTMENT | Freq: Two times a day (BID) | CUTANEOUS | 0 refills | Status: AC

## 2020-05-09 MED ORDER — NAPROXEN 500 MG PO TABS: 500.0000 mg | ORAL_TABLET | Freq: Two times a day (BID) | ORAL | 0 refills | Status: AC

## 2020-05-09 MED ORDER — HYDROXYZINE HCL 25 MG PO TABS: 25.0000 mg | ORAL_TABLET | Freq: Four times a day (QID) | ORAL | 0 refills | Status: AC

## 2020-05-09 NOTE — Discharge Instructions (Signed)
Your COVID test is pending - it is important to quarantine / isolate at home until your results are back. °If you test positive and would like further evaluation for persistent or worsening symptoms, you may schedule an E-visit or virtual (video) visit throughout the [facility] MyChart app or website. ° °PLEASE NOTE: If you develop severe chest pain or shortness of breath please go to the ER or call 9-1-1 for further evaluation --> DO NOT schedule electronic or virtual visits for this. °Please call our office for further guidance / recommendations as needed. ° °For information about the Covid vaccine, please visit [email or web address] °

## 2020-05-09 NOTE — ED Triage Notes (Signed)
Pt presents to [facility] for 2-3 days of headache, scratchy throat, mild cough, stuffy nose.  Denies body aches or SOB.

## 2020-05-09 NOTE — ED Provider Notes (Signed)
[facility]    CSN: [id number] Arrival date & time: 05/09/20  1930      History   Chief Complaint Chief Complaint  Patient presents with  . Headache    HPI Beverly Miller is a 44 y.o. female with history of hypertension presenting for 2-3-day course of generalized headaches, scratchy throat, dry cough, rhinorrhea.  Patient denies myalgias, shortness of breath, chest pain, vomiting, change in bowel or bladder habit.  Patient states that she had secondary exposure to Covid.  Requesting testing today.   Past Medical History:  Diagnosis Date  . Hypertension     There are no problems to display for this patient.   History reviewed. No pertinent surgical history.  OB History   No obstetric history on file.      Home Medications    Prior to Admission medications   Medication Sig Start Date End Date Taking? Authorizing Provider  hydrOXYzine (ATARAX/VISTARIL) 25 MG tablet Take 1 tablet (25 mg total) by mouth every 6 (six) hours. 05/09/20   Hall-Potvin, [place], PA-C  naproxen (NAPROSYN) 500 MG tablet Take 1 tablet (500 mg total) by mouth 2 (two) times daily. 05/09/20   Hall-Potvin, [place], PA-C  triamcinolone ointment (KENALOG) 0.5 % Apply 1 application topically 2 (two) times daily. 05/09/20   Hall-Potvin, [place], PA-C    Family History History reviewed. No pertinent family history.  Social History Social History   Tobacco Use  . Smoking status: Former Smoker    Quit date: 11/12/2018    Years since quitting: 1.4  . Smokeless tobacco: Never Used  Substance Use Topics  . Alcohol use: No  . Drug use: No     Allergies   Patient has no known allergies.   Review of Systems As per HPI   Physical Exam Triage Vital Signs ED Triage Vitals  Enc Vitals Group     BP      Pulse      Resp      Temp      Temp src      SpO2      Weight      Height      Head Circumference      Peak Flow      Pain Score      Pain Loc      Pain Edu?      Excl.  in [place]?    No data found.  Updated Vital Signs BP (!) 202/124 (BP Location: Left Arm)   Pulse 98   Temp 98.8 F (37.1 C) (Oral)   Resp 16   SpO2 98%   Visual Acuity Right Eye Distance:   Left Eye Distance:   Bilateral Distance:    Right Eye Near:   Left Eye Near:    Bilateral Near:     Physical Exam Constitutional:      General: She is not in acute distress. HENT:     Head: Normocephalic and atraumatic.  Eyes:     General: No scleral icterus.    Pupils: Pupils are equal, round, and reactive to light.  Cardiovascular:     Rate and Rhythm: Normal rate.  Pulmonary:     Effort: Pulmonary effort is normal.  Skin:    Coloration: Skin is not jaundiced or pale.     Findings: Rash present.     Comments: Papular dermatitis noted to right forearm ventral aspect.  Neurological:     Mental Status: She is alert and oriented to person,  place, and time.      UC Treatments / Results  Labs (all labs ordered are listed, but only abnormal results are displayed) Labs Reviewed  NOVEL CORONAVIRUS, NAA    EKG   Radiology No results found.  Procedures Procedures (including critical care time)  Medications Ordered in UC Medications - No data to display  Initial Impression / Assessment and Plan / UC Course  I have reviewed the triage vital signs and the nursing notes.  Pertinent labs & imaging results that were available during my care of the patient were reviewed by me and considered in my medical decision making (see chart for details).     Patient afebrile, nontoxic, with SpO2 98%.  Covid PCR pending.  Patient to quarantine until results are back.  We will treat supportively as outlined below.  Return precautions discussed, patient verbalized understanding and is agreeable to plan. Final Clinical Impressions(s) / UC Diagnoses   Final diagnoses:  Acute nonintractable headache, unspecified headache type  Exposure to COVID-19 virus  Dermatitis     Discharge  Instructions     Your COVID test is pending - it is important to quarantine / isolate at home until your results are back. If you test positive and would like further evaluation for persistent or worsening symptoms, you may schedule an E-visit or virtual (video) visit throughout the [facility] app or website.  PLEASE NOTE: If you develop severe chest pain or shortness of breath please go to the ER or call 9-1-1 for further evaluation --> DO NOT schedule electronic or virtual visits for this. Please call our office for further guidance / recommendations as needed.  For information about the Covid vaccine, please visit [email or web address]    ED Prescriptions    Medication Sig Dispense Auth. Provider   naproxen (NAPROSYN) 500 MG tablet Take 1 tablet (500 mg total) by mouth 2 (two) times daily. 30 tablet Hall-Potvin, [place], PA-C   hydrOXYzine (ATARAX/VISTARIL) 25 MG tablet Take 1 tablet (25 mg total) by mouth every 6 (six) hours. 12 tablet Hall-Potvin, [place], PA-C   triamcinolone ointment (KENALOG) 0.5 % Apply 1 application topically 2 (two) times daily. 30 g Hall-Potvin, [place], PA-C     PDMP not reviewed this encounter.   Hall-Potvin, [place], [place] 05/09/20 2003

## 2020-05-11 LAB — NOVEL CORONAVIRUS, NAA: SARS-CoV-2, NAA: NOT DETECTED

## 2020-05-11 LAB — SARS-COV-2, NAA 2 DAY TAT

## 2020-05-12 ENCOUNTER — Ambulatory Visit
Admission: EM | Admit: 2020-05-12 | Discharge: 2020-05-12 | Disposition: A | Discharge status: Home / Self Care | Payer: Medicaid Other | Attending: Physician Assistant | Admitting: Physician Assistant

## 2020-05-12 ENCOUNTER — Encounter: Payer: Self-pay | Admitting: Physician Assistant

## 2020-05-12 ENCOUNTER — Other Ambulatory Visit: Payer: Self-pay

## 2020-05-12 DIAGNOSIS — S90424A Blister (nonthermal), right lesser toe(s), initial encounter: Secondary | ICD-10-CM

## 2020-05-12 NOTE — ED Triage Notes (Signed)
Seen by provider

## 2020-05-12 NOTE — ED Provider Notes (Signed)
[facility]    CSN: [id number] Arrival date & time: 05/12/20  1938      History   Chief Complaint Chief Complaint  Patient presents with  . Foot Pain    HPI Beverly Miller is a 44 y.o. female.   44 year old female comes in for 1-2 week history of right foot pain. Denies injury/trauma. No pain at rest, pain with palpation. No pain with ROM of the foot. No numbness/tingling. Work requires long hours of walking, and feels that pain is worse after work.  Patient was found hypertensive at 226/134. States history of HTN, not on any medications. Denies chest pain, shortness of breath, vision changes, one sided weakness, dizziness, syncope.      Past Medical History:  Diagnosis Date  . Hypertension     There are no problems to display for this patient.   History reviewed. No pertinent surgical history.  OB History   No obstetric history on file.      Home Medications    Prior to Admission medications   Medication Sig Start Date End Date Taking? Authorizing Provider  hydrOXYzine (ATARAX/VISTARIL) 25 MG tablet Take 1 tablet (25 mg total) by mouth every 6 (six) hours. 05/09/20   Hall-Potvin, [place], PA-C  naproxen (NAPROSYN) 500 MG tablet Take 1 tablet (500 mg total) by mouth 2 (two) times daily. 05/09/20   Hall-Potvin, [place], PA-C  triamcinolone ointment (KENALOG) 0.5 % Apply 1 application topically 2 (two) times daily. 05/09/20   Hall-Potvin, [place], PA-C    Family History History reviewed. No pertinent family history.  Social History Social History   Tobacco Use  . Smoking status: Former Smoker    Quit date: 11/12/2018    Years since quitting: 1.4  . Smokeless tobacco: Never Used  Substance Use Topics  . Alcohol use: No  . Drug use: No     Allergies   Patient has no known allergies.   Review of Systems Review of Systems  Reason unable to perform ROS: See HPI as above.     Physical Exam Triage Vital Signs ED Triage Vitals  [05/12/20 1954]  Enc Vitals Group     BP (!) 226/137     Pulse Rate 94     Resp 18     Temp 98 F (36.7 C)     Temp Source Oral     SpO2 99 %     Weight      Height      Head Circumference      Peak Flow      Pain Score      Pain Loc      Pain Edu?      Excl. in [place]?    No data found.  Updated Vital Signs BP (!) 226/134 (BP Location: Left Arm)   Pulse 94   Temp 98 F (36.7 C) (Oral)   Resp 18   SpO2 99%   Physical Exam Constitutional:      General: She is not in acute distress.    Appearance: Normal appearance. She is well-developed. She is not toxic-appearing or diaphoretic.  HENT:     Head: Normocephalic and atraumatic.  Eyes:     Conjunctiva/sclera: Conjunctivae normal.     Pupils: Pupils are equal, round, and reactive to light.  Pulmonary:     Effort: Pulmonary effort is normal. No respiratory distress.     Comments: Speaking in full sentences without difficulty Musculoskeletal:     Cervical back: Normal  range of motion and neck supple.     Comments: Multiple blisters to the toe as pictures below. Blisters intact. No erythema, warmth. Full ROM of toes. Sensation intact.  Skin:    General: Skin is warm and dry.  Neurological:     Mental Status: She is alert and oriented to person, place, and time.          UC Treatments / Results  Labs (all labs ordered are listed, but only abnormal results are displayed) Labs Reviewed - No data to display  EKG   Radiology No results found.  Procedures Procedures (including critical care time)  Medications Ordered in UC Medications - No data to display  Initial Impression / Assessment and Plan / UC Course  I have reviewed the triage vital signs and the nursing notes.  Pertinent labs & imaging results that were available during my care of the patient were reviewed by me and considered in my medical decision making (see chart for details).    Blisters likely from frictions/moisture/heat. Will provide post  op boot for now to decrease friction. Keep area clean and dry. Return precautions given.  Patient hypertensive in office. Remains asymptomatic. Declined medications at this time. To decrease salt intake. Push fluids. Strict return precautions given.  Final Clinical Impressions(s) / UC Diagnoses   Final diagnoses:  Blister of toe of right foot, initial encounter    ED Prescriptions    None     PDMP not reviewed this encounter.   Belinda Fisher, PA-C 05/12/20 2201

## 2020-05-12 NOTE — Discharge Instructions (Signed)
Keep foot clean and dry. Ice compress to help with any swelling. Post op boot to prevent further rubbing/friction. Follow up with PCP for further evaluation.  As discussed, your blood pressure was really high today. Please talk with your PCP about starting medicine to bring this down. If having chest pain, shortness of breath, weakness to one side of your body, go to the emergency department for further evaluation.

## 2020-06-14 ENCOUNTER — Ambulatory Visit
Admission: EM | Admit: 2020-06-14 | Discharge: 2020-06-14 | Discharge status: Absent without leave | Payer: Medicaid Other

## 2020-06-14 ENCOUNTER — Ambulatory Visit: Payer: Self-pay

## 2020-06-14 ENCOUNTER — Other Ambulatory Visit: Payer: Self-pay | Admitting: Family Medicine

## 2020-06-14 ENCOUNTER — Other Ambulatory Visit: Payer: Self-pay

## 2020-06-14 DIAGNOSIS — M79675 Pain in left toe(s): Secondary | ICD-10-CM

## 2021-03-16 DIAGNOSIS — Z124 Encounter for screening for malignant neoplasm of cervix: Secondary | ICD-10-CM | POA: Diagnosis not present

## 2021-03-16 DIAGNOSIS — Z01419 Encounter for gynecological examination (general) (routine) without abnormal findings: Secondary | ICD-10-CM | POA: Diagnosis not present

## 2021-03-16 DIAGNOSIS — N951 Menopausal and female climacteric states: Secondary | ICD-10-CM | POA: Diagnosis not present

## 2021-03-16 DIAGNOSIS — R87610 Atypical squamous cells of undetermined significance on cytologic smear of cervix (ASC-US): Secondary | ICD-10-CM | POA: Diagnosis not present

## 2021-03-16 DIAGNOSIS — Z113 Encounter for screening for infections with a predominantly sexual mode of transmission: Secondary | ICD-10-CM | POA: Diagnosis not present

## 2021-03-16 DIAGNOSIS — Z3202 Encounter for pregnancy test, result negative: Secondary | ICD-10-CM | POA: Diagnosis not present

## 2021-04-18 DIAGNOSIS — N951 Menopausal and female climacteric states: Secondary | ICD-10-CM | POA: Diagnosis not present

## 2021-04-18 DIAGNOSIS — L659 Nonscarring hair loss, unspecified: Secondary | ICD-10-CM | POA: Diagnosis not present

## 2021-04-18 DIAGNOSIS — L84 Corns and callosities: Secondary | ICD-10-CM | POA: Diagnosis not present

## 2021-04-18 DIAGNOSIS — I10 Essential (primary) hypertension: Secondary | ICD-10-CM | POA: Diagnosis not present

## 2021-04-24 DIAGNOSIS — N911 Secondary amenorrhea: Secondary | ICD-10-CM | POA: Diagnosis not present

## 2021-05-01 ENCOUNTER — Other Ambulatory Visit: Payer: Self-pay

## 2021-05-01 ENCOUNTER — Ambulatory Visit (INDEPENDENT_AMBULATORY_CARE_PROVIDER_SITE_OTHER): Payer: Federal, State, Local not specified - PPO | Admitting: Podiatry

## 2021-05-01 DIAGNOSIS — M2041 Other hammer toe(s) (acquired), right foot: Secondary | ICD-10-CM | POA: Diagnosis not present

## 2021-05-01 DIAGNOSIS — R52 Pain, unspecified: Secondary | ICD-10-CM

## 2021-05-01 DIAGNOSIS — L84 Corns and callosities: Secondary | ICD-10-CM | POA: Diagnosis not present

## 2021-05-01 NOTE — Patient Instructions (Signed)
Look for urea 40% cream or ointment and apply to the thickened dry skin / calluses. This can be bought over the counter, at a pharmacy or online such as [employer].   Silicone toe caps can be purchased on [employer]

## 2021-05-02 ENCOUNTER — Encounter: Payer: Self-pay | Admitting: Podiatry

## 2021-05-02 NOTE — Progress Notes (Signed)
  Subjective:  Patient ID: Beverly Miller, female    DOB: [birth year],  MRN: [id number]  Chief Complaint  Patient presents with   Callouses    PT states that she has a callus between her right and 5th toe    45 y.o. female presents with the above complaint. History confirmed with patient.  Also has 1 on her right hallux can be very painful.  8 out of 10 when its worst.  Objective:  Physical Exam: warm, good capillary refill, no trophic changes or ulcerative lesions, normal DP and PT pulses, and normal sensory exam. Right Foot: Some reducible hammertoe contractures 4 and 5 with adductovarus rotation of the fifth toe, she has a heloma molle on the distal fifth toe medial to the distal phalanx, heloma durum on the medial hallux   Assessment:   1. Callus of foot   2. Pain   3. Hammertoe of right foot      Plan:  Patient was evaluated and treated and all questions answered.  All symptomatic hyperkeratoses were safely debrided with a sterile #15 blade to patient's level of comfort without incident. We discussed preventative and palliative care of these lesions including supportive and accommodative shoegear, padding, prefabricated and custom molded accommodative orthoses, use of a pumice stone and lotions/creams daily.  Also discussed surgical correction of the deformity and calluses with arthroplasty of the fourth and fifth toes if it returns or worsens.  Recommended urea cream and silicone toe caps which she will personal [employer].  Return as needed.  No follow-ups on file.

## 2021-06-19 DIAGNOSIS — Z79890 Hormone replacement therapy (postmenopausal): Secondary | ICD-10-CM | POA: Diagnosis not present

## 2021-11-23 ENCOUNTER — Other Ambulatory Visit: Payer: Self-pay

## 2021-11-23 ENCOUNTER — Encounter (HOSPITAL_BASED_OUTPATIENT_CLINIC_OR_DEPARTMENT_OTHER): Payer: Self-pay | Admitting: *Deleted

## 2021-11-23 ENCOUNTER — Ambulatory Visit
Admission: EM | Admit: 2021-11-23 | Discharge: 2021-11-23 | Disposition: A | Discharge status: Home / Self Care | Payer: Federal, State, Local not specified - PPO

## 2021-11-23 ENCOUNTER — Emergency Department (HOSPITAL_BASED_OUTPATIENT_CLINIC_OR_DEPARTMENT_OTHER)
Admission: EM | Admit: 2021-11-23 | Discharge: 2021-11-24 | Disposition: A | Discharge status: Home / Self Care | Payer: Federal, State, Local not specified - PPO | Attending: Emergency Medicine | Admitting: Emergency Medicine

## 2021-11-23 ENCOUNTER — Encounter: Payer: Self-pay | Admitting: Emergency Medicine

## 2021-11-23 DIAGNOSIS — I161 Hypertensive emergency: Secondary | ICD-10-CM

## 2021-11-23 DIAGNOSIS — R519 Headache, unspecified: Secondary | ICD-10-CM

## 2021-11-23 DIAGNOSIS — Z79899 Other long term (current) drug therapy: Secondary | ICD-10-CM | POA: Diagnosis not present

## 2021-11-23 DIAGNOSIS — I10 Essential (primary) hypertension: Secondary | ICD-10-CM | POA: Diagnosis not present

## 2021-11-23 LAB — COMPREHENSIVE METABOLIC PANEL
ALT: 10 U/L (ref 0–44)
AST: 11 U/L — ABNORMAL LOW (ref 15–41)
Albumin: 4.7 g/dL (ref 3.5–5.0)
Alkaline Phosphatase: 54 U/L (ref 38–126)
Anion gap: 8 (ref 5–15)
BUN: 13 mg/dL (ref 6–20)
CO2: 26 mmol/L (ref 22–32)
Calcium: 9.6 mg/dL (ref 8.9–10.3)
Chloride: 105 mmol/L (ref 98–111)
Creatinine, Ser: 0.7 mg/dL (ref 0.44–1.00)
GFR, Estimated: >60 mL/min (ref >60)
Glucose, Bld: 95 mg/dL (ref 70–99)
Potassium: 3.3 mmol/L — ABNORMAL LOW (ref 3.5–5.1)
Sodium: 139 mmol/L (ref 135–145)
Total Bilirubin: 0.4 mg/dL (ref 0.3–1.2)
Total Protein: 7.8 g/dL (ref 6.5–8.1)

## 2021-11-23 LAB — CBC WITH DIFFERENTIAL/PLATELET
Abs Immature Granulocytes: 0.03 10*3/uL (ref 0.00–0.07)
Basophils Absolute: 0 10*3/uL (ref 0.0–0.1)
Basophils Relative: 0 %
Eosinophils Absolute: 0.1 10*3/uL (ref 0.0–0.5)
Eosinophils Relative: 1 %
HCT: 43.2 % (ref 36.0–46.0)
Hemoglobin: 14.6 g/dL (ref 12.0–15.0)
Immature Granulocytes: 0 %
Lymphocytes Relative: 37 %
Lymphs Abs: 3.7 10*3/uL (ref 0.7–4.0)
MCH: 28.2 pg (ref 26.0–34.0)
MCHC: 33.8 g/dL (ref 30.0–36.0)
MCV: 83.6 fL (ref 80.0–100.0)
Monocytes Absolute: 0.4 10*3/uL (ref 0.1–1.0)
Monocytes Relative: 4 %
Neutro Abs: 5.8 10*3/uL (ref 1.7–7.7)
Neutrophils Relative %: 58 %
Platelets: 346 10*3/uL (ref 150–400)
RBC: 5.17 MIL/uL — ABNORMAL HIGH (ref 3.87–5.11)
RDW: 13.3 % (ref 11.5–15.5)
WBC: 10 10*3/uL (ref 4.0–10.5)
nRBC: 0 % (ref 0.0–0.2)

## 2021-11-23 NOTE — ED Triage Notes (Signed)
Last two days patient has had a 9/10 headache that is constant and not easing off, reports never having had a headache like this in the past. Takes medication for BP - took it this morning. BP 182/133 in triage. Denies nausea, vomiting, loss of consciousness, blurred vision. Also reports recent nasal congestion starting 2 days ago. Has been taking ibuprofen and some sinus medication over the last couple of days to help relieve those symptoms

## 2021-11-23 NOTE — ED Triage Notes (Signed)
Sent here from UC for eval , c/o h/a  x 2 days and HTN , increased stress at work

## 2021-11-23 NOTE — Discharge Instructions (Addendum)
You came to the emergency department today to be evaluated for your headache and high blood pressure.  Your lab work and physical exam are reassuring.  It was recommended that you have a noncontrast head CT scan to further evaluate for your headache however you declined this imaging at this time.  Your blood pressure gradually improved while in the emergency department.  Please check your blood pressure daily and record these values.  Bring this information to your primary care doctor for further management of your blood pressure.  Please call your primary care doctor tomorrow to schedule a follow-up appointment as soon as possible.  Get help right away if you: Develop a severe headache or confusion. Have unusual weakness or numbness. Feel faint. Have severe pain in your chest or abdomen. Vomit repeatedly. Have trouble breathing.

## 2021-11-23 NOTE — ED Provider Notes (Signed)
[facility] EMERGENCY DEPARTMENT Provider Note   CSN: [id number] Arrival date & time: 11/23/21  2030     History  Chief Complaint  Patient presents with   Hypertension    Beverly Miller is a 46 y.o. female with a past medical history of hypertension.  Presents to the emergency department with a chief plaint of headache and hypertension.  Patient reports that she has had intermittent headaches over the last 2 days.  Headache onset is gradual pain progressively worse over time.  Patient states that pain is located to frontotemporal aspect of head.  Patient states that earlier today headache pain was worse than previous headaches.  At that time patient rated pain 8/10 on the pain scale.  At present patient rates pain 6/10 on the pain scale.  Patient denies any recent falls or traumatic injuries.  Patient denies any numbness, weakness, facial asymmetry, dysarthria, visual disturbance, chest pain, shortness of breath, palpitations, leg swelling or tenderness.  Per chart review patient went to urgent care earlier today and was sent to the emergency department due to elevated blood pressure and headache.  Patient reports that she is currently taking losartan/HCTZ combination pill 50 mg / 12.5 mg.  Patient states that she is compliant with this medication except for occasional missed doses.  Patient denies missing any doses recently.  Patient endorses occasional alcohol use.  Denies any illicit drug use.  Patient endorses nicotine vaporizer use.   Hypertension Associated symptoms include headaches. Pertinent negatives include no chest pain, no abdominal pain and no shortness of breath.      Home Medications Prior to Admission medications   Medication Sig Start Date End Date Taking? Authorizing Provider  hydrOXYzine (ATARAX/VISTARIL) 25 MG tablet Take 1 tablet (25 mg total) by mouth every 6 (six) hours. 05/09/20   Hall-Potvin, [place], PA-C  losartan-hydrochlorothiazide (HYZAAR)  50-12.5 MG tablet Take 1 tablet by mouth daily. 10/31/21   Provider, Historical, Beverly Miller  naproxen (NAPROSYN) 500 MG tablet Take 1 tablet (500 mg total) by mouth 2 (two) times daily. 05/09/20   Hall-Potvin, [place], PA-C  triamcinolone ointment (KENALOG) 0.5 % Apply 1 application topically 2 (two) times daily. 05/09/20   Hall-Potvin, [place], PA-C      Allergies    Patient has no known allergies.    Review of Systems   Review of Systems  Constitutional:  Negative for chills and fever.  Eyes:  Negative for visual disturbance.  Respiratory:  Negative for shortness of breath.   Cardiovascular:  Negative for chest pain, palpitations and leg swelling.  Gastrointestinal:  Negative for abdominal pain, nausea and vomiting.  Genitourinary:  Negative for difficulty urinating and dysuria.  Musculoskeletal:  Negative for back pain and neck pain.  Skin:  Negative for color change and rash.  Neurological:  Positive for headaches. Negative for dizziness, tremors, seizures, syncope, facial asymmetry, speech difficulty, weakness, light-headedness and numbness.  Psychiatric/Behavioral:  Negative for confusion.    Physical Exam Updated Vital Signs BP (!) 213/117 (BP Location: Right Arm)    Pulse 85    Temp 98.1 F (36.7 C) (Oral)    Resp 20    Ht 5\' 5"  (1.651 m)    Wt 87.5 kg    SpO2 100%    BMI 32.12 kg/m  Physical Exam Vitals and nursing note reviewed.  Constitutional:      General: She is not in acute distress.    Appearance: She is not ill-appearing, toxic-appearing or diaphoretic.  HENT:  Head: Normocephalic and atraumatic.  Eyes:     General: No scleral icterus.       Right eye: No discharge.        Left eye: No discharge.     Extraocular Movements: Extraocular movements intact.     Conjunctiva/sclera: Conjunctivae normal.     Pupils: Pupils are equal, round, and reactive to light.  Cardiovascular:     Rate and Rhythm: Normal rate.     Pulses:          Carotid pulses are 2+ on the right  side and 2+ on the left side.    Heart sounds: Normal heart sounds.  Pulmonary:     Effort: Pulmonary effort is normal. No tachypnea or bradypnea.     Breath sounds: Normal breath sounds. No stridor.  Musculoskeletal:     Cervical back: Normal range of motion and neck supple. No rigidity.  Skin:    General: Skin is warm and dry.  Neurological:     General: No focal deficit present.     Mental Status: She is alert.     GCS: GCS eye subscore is 4. GCS verbal subscore is 5. GCS motor subscore is 6.     Cranial Nerves: Cranial nerves 2-12 are intact. No cranial nerve deficit, dysarthria or facial asymmetry.     Sensory: Sensation is intact.     Motor: No weakness, tremor or seizure activity.     Coordination: Romberg sign negative. Finger-Nose-Finger Test normal.     Gait: Gait is intact. Gait normal.     Comments: CN II-XII intact, equal grip strength, +5 strength to bilateral upper and lower extremities, sensation to light touch grossly intact to bilateral upper and lower extremities  Psychiatric:        Behavior: Behavior is cooperative.    ED Results / Procedures / Treatments   Labs (all labs ordered are listed, but only abnormal results are displayed) Labs Reviewed  CBC WITH DIFFERENTIAL/PLATELET - Abnormal; Notable for the following components:      Result Value   RBC 5.17 (*)    All other components within normal limits  COMPREHENSIVE METABOLIC PANEL - Abnormal; Notable for the following components:   Potassium 3.3 (*)    AST 11 (*)    All other components within normal limits    EKG EKG Interpretation  Date/Time:  Thursday November 23 2021 23:35:08 EST Ventricular Rate:  85 PR Interval:  211 QRS Duration: 96 QT Interval:  372 QTC Calculation: 443 R Axis:   -11 Text Interpretation: Sinus rhythm Prolonged PR interval Left atrial enlargement Abnormal R-wave progression, early transition Left ventricular hypertrophy No previous ECGs available Confirmed by Beverly Miller, Beverly Miller  [phone]([id number]) on 11/23/2021 11:41:31 PM  Radiology No results found.  Procedures Procedures    Medications Ordered in ED Medications - No data to display  ED Course/ Medical Decision Making/ A&P                           Medical Decision Making  This patient presents to the ED for concern of headache and hypertension, this involves an extensive number of treatment options, and is a complaint that carries with it a high risk of complications and morbidity.  The differential diagnosis includes but is not limited to subarachnoid hemorrhage, hypertensive urgency, hypertensive emergency, asymptomatic hypertension.   Co morbidities that complicate the patient evaluation  Hypertension   Additional history obtained:  External records from outside source  obtained and reviewed including previous provider notes   Lab Tests:  I Ordered, and personally interpreted labs.  The pertinent results include:   CBC unremarkable CMP shows creatinine and BUN within normal limits   Cardiac Monitoring:  The patient was maintained on a cardiac monitor.  I personally viewed and interpreted the cardiac monitored which showed an underlying rhythm of: Sinus rhythm, prolonged PR interval, left atrial enlargement Left ventricular hypertrophy   Test Considered:  CTA head and neck Due to patient's report of worst headache of her life in setting of hypertension concern for possible subarachnoid hemorrhage as well as intracranial mass.  Head imaging was planned to be ordered however patient declines at this time AGAINST MEDICAL ADVICE   Problem List / ED Course:  Hypertension Patient noted to be hypertensive at 213/117.  While in emergency department hypertension gradually improved with final reading 177/114.  Hypertension gradually improved without any medication. Patient had no chest pain, shortness of breath, numbness, weakness, or visual disturbance.  Creatinine within normal limits.  EKG shows no  ischemic changes.  No signs of endorgan failure to do note hypertensive emergency at this time Headache was elevated in the setting of acute pain related to headache.  Unclear if patient's pain was driving hypertension.  With spontaneous improvement in hypertension without medication we will hold any medication changes at this time. Patient was advised to continue taking her hypertension medication as prescribed.  Patient was advised to monitor blood pressure at home daily.  Patient advised to have close follow-up with PCP for further hypertension management Headache Patient has had intermittent headaches over the last 2 days.  Headache onset is gradual pain progressively worse over time.  Patient has had improvement in pain when taking ibuprofen. Patient denies any numbness, weakness, facial asymmetry, dysarthria, or visual disturbance. Neuro exam is reassuring. Due to patient reporting that headache earlier today was the worst experience plan was to obtain CTA to evaluate for possible subarachnoid hemorrhage versus intracranial mass.  Patient declined head CT imaging at this time AGAINST MEDICAL ADVICE.  Patient care was discussed with attending physician Dr. Read Drivers.  Reevaluation:  After the interventions noted above, I reevaluated the patient and found that they have :improved   Disposition:  After consideration of the diagnostic results and the patients response to treatment, I feel that the patent would benefit from discharge and close follow-up with PCP.          Final Clinical Impression(s) / ED Diagnoses Final diagnoses:  Hypertension, unspecified type  Acute nonintractable headache, unspecified headache type    Rx / DC Orders ED Discharge Orders     None         Haskel Schroeder, PA-C 11/24/21 0319    [place], Beverly Ruiz, Beverly Miller 11/24/21 0430

## 2021-11-23 NOTE — ED Provider Notes (Signed)
Patient here today with worst headache of her life. She states headache has been present for last 2 days. She rates at a 9/10. She does have elevated BP in office today- this has been the case with recent visits over the last few years and she has declined treatment. Recommended further evaluation in the ED as I suspect she will need imaging of her head. Patient is agreeable with plan. Refuses EMS transport AMA.   Tomi Bamberger, PA-C 11/23/21 2007

## 2022-01-04 DIAGNOSIS — I10 Essential (primary) hypertension: Secondary | ICD-10-CM | POA: Diagnosis not present

## 2022-01-04 DIAGNOSIS — R457 State of emotional shock and stress, unspecified: Secondary | ICD-10-CM | POA: Diagnosis not present

## 2022-01-04 DIAGNOSIS — F419 Anxiety disorder, unspecified: Secondary | ICD-10-CM | POA: Diagnosis not present

## 2022-03-28 ENCOUNTER — Other Ambulatory Visit: Payer: Self-pay | Admitting: Internal Medicine

## 2022-03-28 DIAGNOSIS — Z1231 Encounter for screening mammogram for malignant neoplasm of breast: Secondary | ICD-10-CM

## 2022-03-30 ENCOUNTER — Ambulatory Visit
Admission: RE | Admit: 2022-03-30 | Discharge: 2022-03-30 | Disposition: A | Discharge status: Home / Self Care | Payer: Federal, State, Local not specified - PPO | Source: Ambulatory Visit | Attending: Internal Medicine | Admitting: Internal Medicine

## 2022-03-30 DIAGNOSIS — Z1231 Encounter for screening mammogram for malignant neoplasm of breast: Secondary | ICD-10-CM | POA: Diagnosis not present

## 2023-01-08 IMAGING — MG MM DIGITAL SCREENING BILAT W/ TOMO AND CAD
8 series · 8 of 24 positions shown · non-contrast
Comparison: None available.

CLINICAL DATA: Screening.

EXAM:
DIGITAL SCREENING BILATERAL MAMMOGRAM WITH TOMOSYNTHESIS AND CAD
TECHNIQUE: Bilateral screening digital craniocaudal and mediolateral oblique
mammograms were obtained. Bilateral screening digital breast
tomosynthesis was performed. The images were evaluated with
computer-aided detection.

[R MLO synth-2D]
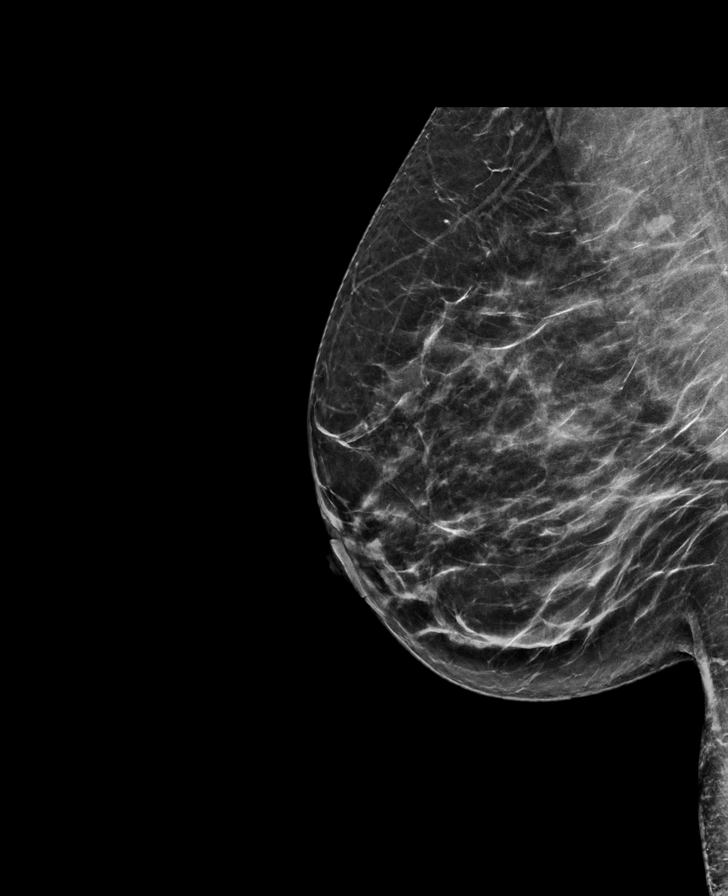

[L MLO synth-2D]
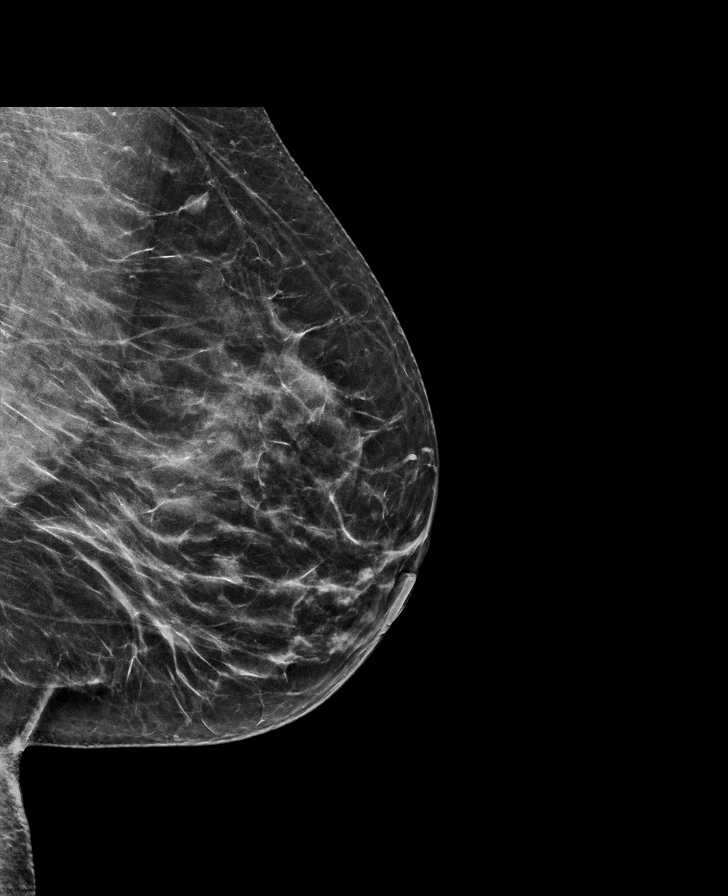

[R CC synth-2D]
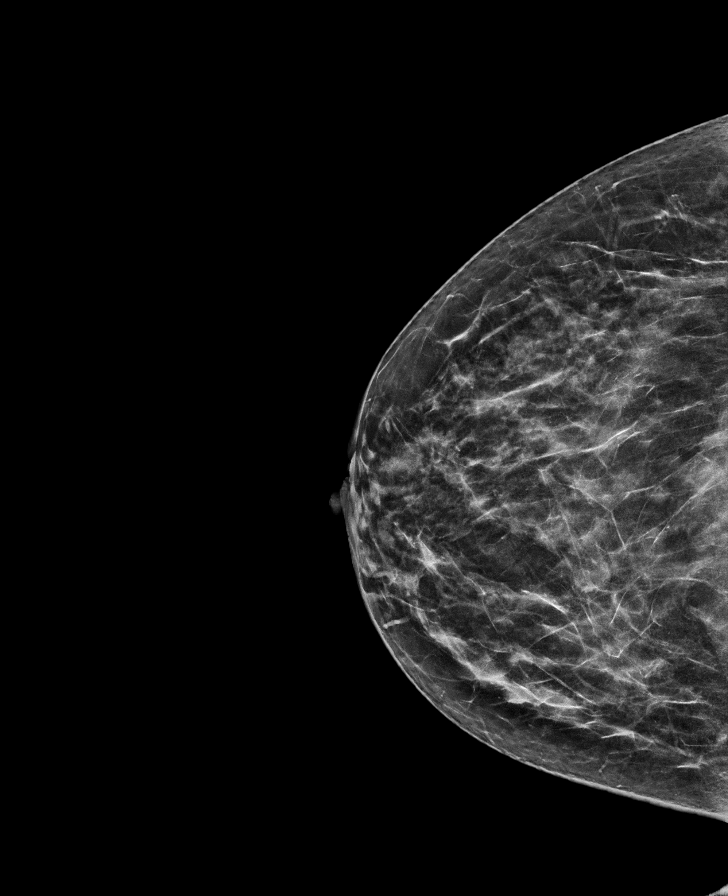

[L CC synth-2D]
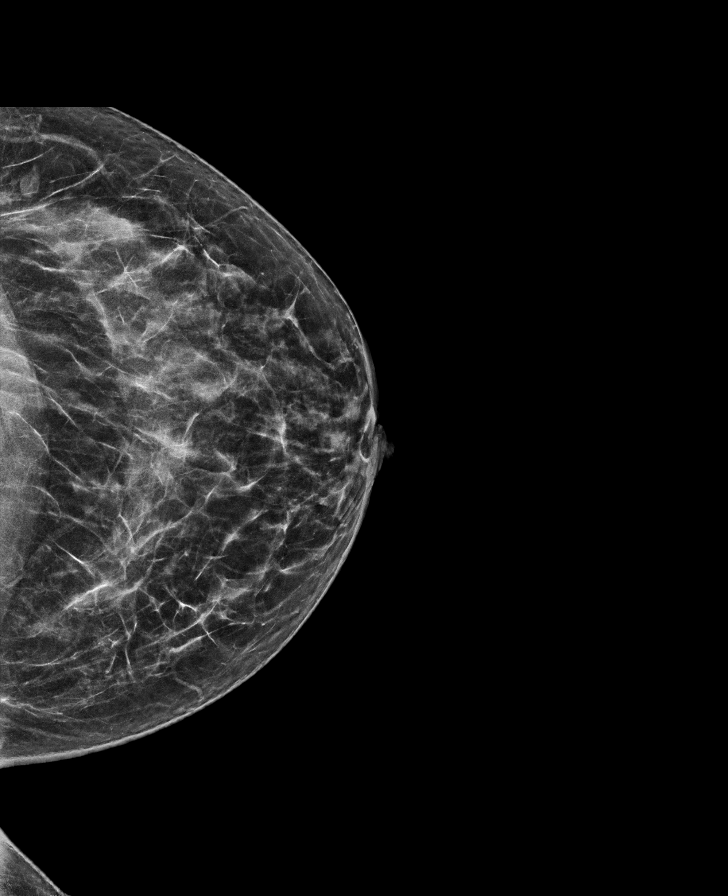

[L MLO tomo · tomo slice 29/57.0]
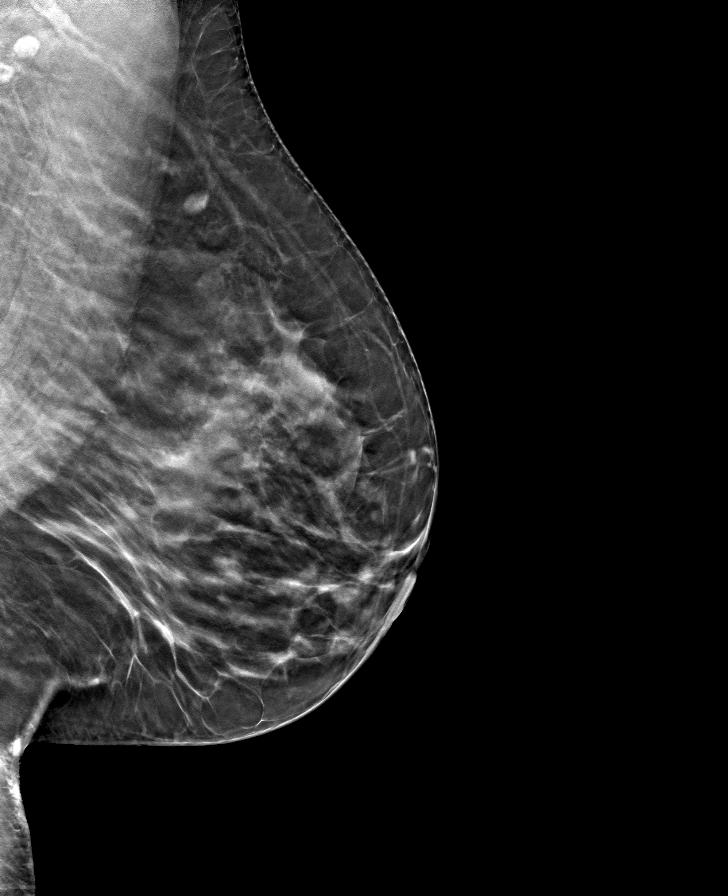

[L CC tomo · tomo slice 30/59.0]
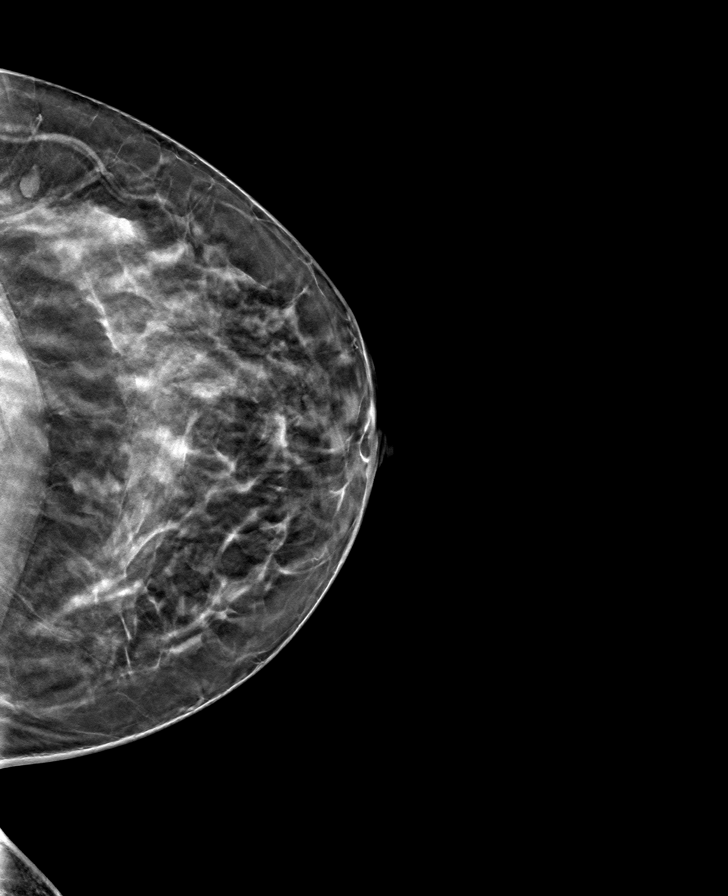

[R CC tomo · tomo slice 28/55.0]
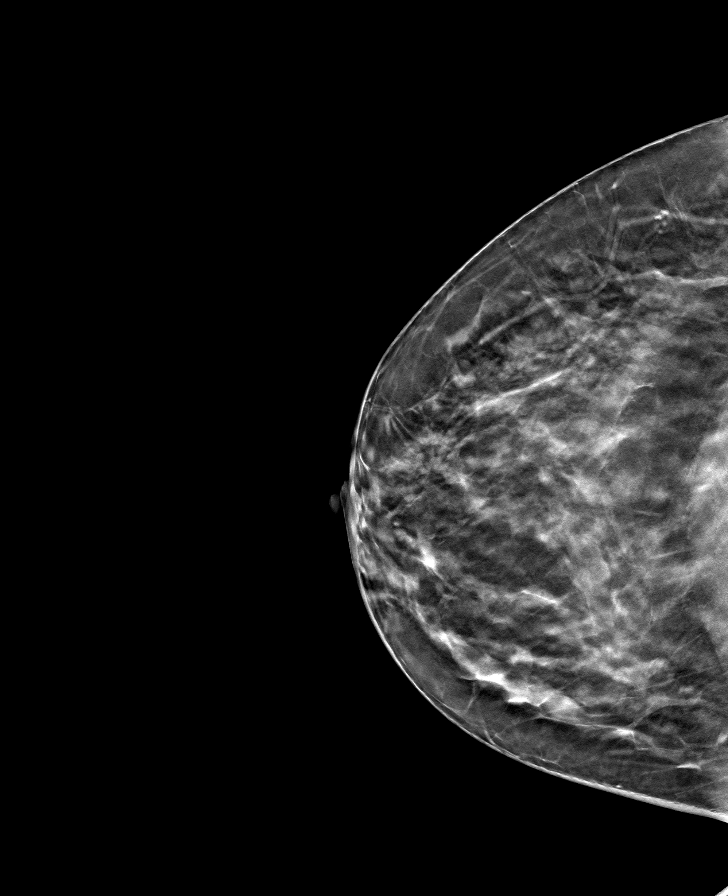

[R MLO tomo · tomo slice 31/60.0]
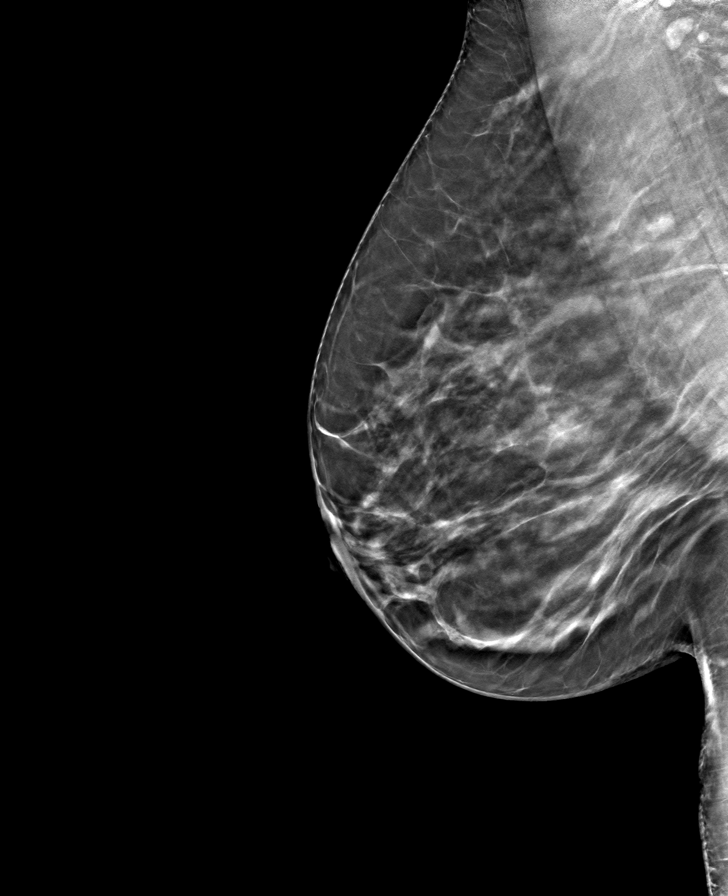

[8 of 24 positions shown; findings below may reference images not displayed]

ACR Breast Density Category c: The breast tissue is heterogeneously
dense, which may obscure small masses
FINDINGS: There are no findings suspicious for malignancy.
IMPRESSION: No mammographic evidence of malignancy. A result letter of this
screening mammogram will be mailed directly to the patient.

RECOMMENDATION:
Screening mammogram in one year. (Code:A7-O-PCM)

BI-RADS CATEGORY  1: Negative.

## 2023-07-23 ENCOUNTER — Ambulatory Visit
Admission: EM | Admit: 2023-07-23 | Discharge: 2023-07-23 | Disposition: A | Discharge status: Home / Self Care | Payer: Federal, State, Local not specified - PPO | Attending: Physician Assistant | Admitting: Physician Assistant

## 2023-07-23 DIAGNOSIS — K529 Noninfective gastroenteritis and colitis, unspecified: Secondary | ICD-10-CM

## 2023-07-23 MED ORDER — ONDANSETRON 4 MG PO TBDP: 4.0000 mg | ORAL_TABLET | Freq: Three times a day (TID) | ORAL | 0 refills | Status: AC | PRN

## 2023-07-23 MED ORDER — ONDANSETRON 4 MG PO TBDP
4.0000 mg | ORAL_TABLET | Freq: Once | ORAL | Status: AC
  Administered 2023-07-23: 4 mg via ORAL

## 2023-07-23 MED ORDER — ONDANSETRON HCL 4 MG/2ML IJ SOLN: 4.0000 mg | Freq: Once | INTRAMUSCULAR | Status: DC

## 2023-07-23 NOTE — ED Notes (Signed)
Provider notified of VS.

## 2023-07-23 NOTE — ED Provider Notes (Addendum)
[facility]    CSN: [id number] Arrival date & time: 07/23/23  1507      History   Chief Complaint Chief Complaint  Patient presents with   Emesis   Diarrhea    HPI Beverly Miller is a 47 y.o. female.   Patient here today for evaluation of diarrhea and vomiting that started in the middle of the night.  She has not had vomiting since 8 AM and last loose stool was 3 AM.  She has not had any fever.  She denies any abdominal pain other than cramping before bowel movement.  She does not report treatment for symptoms.  She did eat at [facility] yesterday but is unsure if this has caused symptoms.  The history is provided by the patient.  Emesis Associated symptoms: diarrhea   Associated symptoms: no abdominal pain, no chills and no fever   Diarrhea Associated symptoms: vomiting   Associated symptoms: no abdominal pain, no chills and no fever     Past Medical History:  Diagnosis Date   Hypertension     There are no problems to display for this patient.   History reviewed. No pertinent surgical history.  OB History   No obstetric history on file.      Home Medications    Prior to Admission medications   Medication Sig Start Date End Date Taking? Authorizing Provider  losartan-hydrochlorothiazide (HYZAAR) 50-12.5 MG tablet Take 1 tablet by mouth daily. 10/31/21  Yes [provider]  ondansetron (ZOFRAN-ODT) 4 MG disintegrating tablet Take 1 tablet (4 mg total) by mouth every 8 (eight) hours as needed. 07/23/23  Yes Tomi Bamberger, PA-C  hydrOXYzine (ATARAX/VISTARIL) 25 MG tablet Take 1 tablet (25 mg total) by mouth every 6 (six) hours. 05/09/20   Hall-Potvin, [place], PA-C  naproxen (NAPROSYN) 500 MG tablet Take 1 tablet (500 mg total) by mouth 2 (two) times daily. 05/09/20   Hall-Potvin, [place], PA-C  triamcinolone ointment (KENALOG) 0.5 % Apply 1 application topically 2 (two) times daily. 05/09/20   Hall-Potvin, [place], PA-C    Family  History History reviewed. No pertinent family history.  Social History Social History   Tobacco Use   Smoking status: Former    Current packs/day: 0.00    Types: Cigarettes    Quit date: 11/12/2018    Years since quitting: 4.6   Smokeless tobacco: Never  Vaping Use   Vaping status: Never Used  Substance Use Topics   Alcohol use: No   Drug use: No     Allergies   Patient has no known allergies.   Review of Systems Review of Systems  Constitutional:  Negative for chills and fever.  Eyes:  Negative for discharge and redness.  Respiratory:  Negative for shortness of breath.   Gastrointestinal:  Positive for diarrhea, nausea and vomiting. Negative for abdominal pain.     Physical Exam Triage Vital Signs ED Triage Vitals  Encounter Vitals Group     BP 07/23/23 1538 (!) 180/127     Systolic BP Percentile --      Diastolic BP Percentile --      Pulse Rate 07/23/23 1538 81     Resp 07/23/23 1538 18     Temp 07/23/23 1538 98.1 F (36.7 C)     Temp Source 07/23/23 1538 Oral     SpO2 07/23/23 1538 98 %     Weight --      Height --      Head Circumference --  Peak Flow --      Pain Score 07/23/23 1534 0     Pain Loc --      Pain Education --      Exclude from Growth Chart --    No data found.  Updated Vital Signs BP (!) 176/103 (BP Location: Left Arm)   Pulse 82   Temp 98.1 F (36.7 C) (Oral)   Resp 18   LMP 08/30/2022 (Exact Date)   SpO2 98%      Physical Exam Vitals and nursing note reviewed.  Constitutional:      General: She is not in acute distress.    Appearance: Normal appearance. She is not ill-appearing.  HENT:     Head: Normocephalic and atraumatic.  Eyes:     Conjunctiva/sclera: Conjunctivae normal.  Cardiovascular:     Rate and Rhythm: Normal rate and regular rhythm.  Pulmonary:     Effort: Pulmonary effort is normal. No respiratory distress.     Breath sounds: No wheezing, rhonchi or rales.  Abdominal:     General: Abdomen is flat.  Bowel sounds are normal. There is no distension.     Palpations: Abdomen is soft.     Tenderness: There is no abdominal tenderness. There is no guarding or rebound.  Neurological:     Mental Status: She is alert.  Psychiatric:        Mood and Affect: Mood normal.        Behavior: Behavior normal.        Thought Content: Thought content normal.      UC Treatments / Results  Labs (all labs ordered are listed, but only abnormal results are displayed) Labs Reviewed - No data to display  EKG   Radiology No results found.  Procedures Procedures (including critical care time)  Medications Ordered in UC Medications - No data to display  Initial Impression / Assessment and Plan / UC Course  I have reviewed the triage vital signs and the nursing notes.  Pertinent labs & imaging results that were available during my care of the patient were reviewed by me and considered in my medical decision making (see chart for details).   Suspect likely viral gastroenteritis.  Recommended Zofran for nausea and increase fluids electrolyte replacement.  Encouraged follow-up if no gradual improvement or with any further concerns.  Patient expresses understanding.  Blood pressure elevated in office but improved compared to prior.  Patient does report having medication at home for blood pressure however given she has not been able to hold down solids most likely not effective   Final Clinical Impressions(s) / UC Diagnoses   Final diagnoses:  Gastroenteritis   Discharge Instructions   None    ED Prescriptions     Medication Sig Dispense Auth. Provider   ondansetron (ZOFRAN-ODT) 4 MG disintegrating tablet Take 1 tablet (4 mg total) by mouth every 8 (eight) hours as needed. 20 tablet Tomi Bamberger, PA-C      PDMP not reviewed this encounter.   Tomi Bamberger, PA-C 07/23/23 1619    Tomi Bamberger, PA-C 07/23/23 1620

## 2023-07-23 NOTE — ED Triage Notes (Signed)
"  I just been having diarrhea and loose stools with vomiting since the middle of the night". Last emesis "around 8am". Last loose stool "3am".

## 2023-12-05 ENCOUNTER — Other Ambulatory Visit: Payer: Self-pay | Admitting: Internal Medicine

## 2023-12-05 DIAGNOSIS — Z23 Encounter for immunization: Secondary | ICD-10-CM | POA: Diagnosis not present

## 2023-12-05 DIAGNOSIS — Z13220 Encounter for screening for lipoid disorders: Secondary | ICD-10-CM | POA: Diagnosis not present

## 2023-12-05 DIAGNOSIS — I10 Essential (primary) hypertension: Secondary | ICD-10-CM | POA: Diagnosis not present

## 2023-12-05 DIAGNOSIS — Z1231 Encounter for screening mammogram for malignant neoplasm of breast: Secondary | ICD-10-CM

## 2023-12-05 DIAGNOSIS — Z0000 Encounter for general adult medical examination without abnormal findings: Secondary | ICD-10-CM | POA: Diagnosis not present

## 2024-01-08 ENCOUNTER — Ambulatory Visit
Admission: RE | Admit: 2024-01-08 | Discharge: 2024-01-08 | Disposition: A | Discharge status: Home / Self Care | Payer: Federal, State, Local not specified - PPO | Source: Ambulatory Visit | Attending: Internal Medicine | Admitting: Internal Medicine

## 2024-01-08 DIAGNOSIS — Z1231 Encounter for screening mammogram for malignant neoplasm of breast: Secondary | ICD-10-CM | POA: Diagnosis not present

## 2024-01-08 DIAGNOSIS — Z113 Encounter for screening for infections with a predominantly sexual mode of transmission: Secondary | ICD-10-CM | POA: Diagnosis not present

## 2024-01-08 DIAGNOSIS — L659 Nonscarring hair loss, unspecified: Secondary | ICD-10-CM | POA: Diagnosis not present

## 2024-01-08 DIAGNOSIS — N841 Polyp of cervix uteri: Secondary | ICD-10-CM | POA: Diagnosis not present

## 2024-01-08 DIAGNOSIS — I10 Essential (primary) hypertension: Secondary | ICD-10-CM | POA: Diagnosis not present

## 2024-01-08 DIAGNOSIS — N898 Other specified noninflammatory disorders of vagina: Secondary | ICD-10-CM | POA: Diagnosis not present

## 2024-01-08 DIAGNOSIS — F410 Panic disorder [episodic paroxysmal anxiety] without agoraphobia: Secondary | ICD-10-CM | POA: Diagnosis not present

## 2024-01-08 DIAGNOSIS — Z01419 Encounter for gynecological examination (general) (routine) without abnormal findings: Secondary | ICD-10-CM | POA: Diagnosis not present

## 2024-01-08 DIAGNOSIS — Z124 Encounter for screening for malignant neoplasm of cervix: Secondary | ICD-10-CM | POA: Diagnosis not present

## 2024-02-13 DIAGNOSIS — L659 Nonscarring hair loss, unspecified: Secondary | ICD-10-CM | POA: Diagnosis not present

## 2024-02-17 DIAGNOSIS — N951 Menopausal and female climacteric states: Secondary | ICD-10-CM | POA: Diagnosis not present

## 2024-02-27 DIAGNOSIS — Z4802 Encounter for removal of sutures: Secondary | ICD-10-CM | POA: Diagnosis not present
